# Patient Record
Sex: Male | Born: 1955 | Race: White | Hispanic: No | Marital: Married | Smoking: Former smoker
Health system: Southern US, Community
[De-identification: ages and names within clinical notes are randomized; demographics above are authoritative.]

## PROBLEM LIST (undated history)

## (undated) DIAGNOSIS — Z87442 Personal history of urinary calculi: Secondary | ICD-10-CM

## (undated) DIAGNOSIS — G4730 Sleep apnea, unspecified: Secondary | ICD-10-CM

## (undated) DIAGNOSIS — F329 Major depressive disorder, single episode, unspecified: Secondary | ICD-10-CM

## (undated) DIAGNOSIS — R519 Headache, unspecified: Secondary | ICD-10-CM

## (undated) DIAGNOSIS — E119 Type 2 diabetes mellitus without complications: Secondary | ICD-10-CM

## (undated) DIAGNOSIS — M1990 Unspecified osteoarthritis, unspecified site: Secondary | ICD-10-CM

## (undated) DIAGNOSIS — R51 Headache: Secondary | ICD-10-CM

## (undated) DIAGNOSIS — G20 Parkinson's disease: Secondary | ICD-10-CM

## (undated) DIAGNOSIS — F32A Depression, unspecified: Secondary | ICD-10-CM

## (undated) DIAGNOSIS — F419 Anxiety disorder, unspecified: Secondary | ICD-10-CM

## (undated) DIAGNOSIS — K219 Gastro-esophageal reflux disease without esophagitis: Secondary | ICD-10-CM

## (undated) DIAGNOSIS — E785 Hyperlipidemia, unspecified: Secondary | ICD-10-CM

## (undated) DIAGNOSIS — I10 Essential (primary) hypertension: Secondary | ICD-10-CM

## (undated) DIAGNOSIS — A498 Other bacterial infections of unspecified site: Secondary | ICD-10-CM

## (undated) HISTORY — PX: ROTATOR CUFF REPAIR: SHX139

## (undated) HISTORY — PX: TONSILLECTOMY: SUR1361

## (undated) HISTORY — PX: WISDOM TOOTH EXTRACTION: SHX21

## (undated) HISTORY — PX: HERNIA REPAIR: SHX51

## (undated) HISTORY — PX: OTHER SURGICAL HISTORY: SHX169

---

## 2007-07-12 ENCOUNTER — Ambulatory Visit (HOSPITAL_BASED_OUTPATIENT_CLINIC_OR_DEPARTMENT_OTHER): Admission: RE | Admit: 2007-07-12 | Discharge: 2007-07-12 | Payer: Self-pay | Admitting: Orthopedic Surgery
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
## 2011-05-31 LAB — POCT I-STAT 4, (NA,K, GLUC, HGB,HCT)
Glucose, Bld: 166 — ABNORMAL HIGH
HCT: 45
Hemoglobin: 15.3
Potassium: 4.6
Sodium: 139

## 2016-11-09 ENCOUNTER — Other Ambulatory Visit: Payer: Self-pay | Admitting: Orthopedic Surgery

## 2016-11-23 ENCOUNTER — Encounter (HOSPITAL_COMMUNITY)
Admission: RE | Admit: 2016-11-23 | Discharge: 2016-11-23 | Disposition: A | Discharge status: Home / Self Care | Payer: Medicare FFS | Source: Ambulatory Visit | Attending: Orthopedic Surgery | Admitting: Orthopedic Surgery

## 2016-11-23 ENCOUNTER — Encounter (HOSPITAL_COMMUNITY): Payer: Self-pay

## 2016-11-23 ENCOUNTER — Ambulatory Visit (HOSPITAL_COMMUNITY)
Admission: RE | Admit: 2016-11-23 | Discharge: 2016-11-23 | Disposition: A | Discharge status: Home / Self Care | Payer: Medicare FFS | Source: Ambulatory Visit | Attending: Orthopedic Surgery | Admitting: Orthopedic Surgery

## 2016-11-23 DIAGNOSIS — Z01812 Encounter for preprocedural laboratory examination: Secondary | ICD-10-CM | POA: Insufficient documentation

## 2016-11-23 DIAGNOSIS — E785 Hyperlipidemia, unspecified: Secondary | ICD-10-CM | POA: Insufficient documentation

## 2016-11-23 DIAGNOSIS — G4733 Obstructive sleep apnea (adult) (pediatric): Secondary | ICD-10-CM | POA: Diagnosis not present

## 2016-11-23 DIAGNOSIS — Z01810 Encounter for preprocedural cardiovascular examination: Secondary | ICD-10-CM | POA: Diagnosis not present

## 2016-11-23 DIAGNOSIS — F329 Major depressive disorder, single episode, unspecified: Secondary | ICD-10-CM | POA: Diagnosis not present

## 2016-11-23 DIAGNOSIS — F17200 Nicotine dependence, unspecified, uncomplicated: Secondary | ICD-10-CM | POA: Insufficient documentation

## 2016-11-23 DIAGNOSIS — Z01818 Encounter for other preprocedural examination: Secondary | ICD-10-CM | POA: Diagnosis not present

## 2016-11-23 DIAGNOSIS — K219 Gastro-esophageal reflux disease without esophagitis: Secondary | ICD-10-CM | POA: Diagnosis not present

## 2016-11-23 DIAGNOSIS — E119 Type 2 diabetes mellitus without complications: Secondary | ICD-10-CM | POA: Diagnosis not present

## 2016-11-23 DIAGNOSIS — I10 Essential (primary) hypertension: Secondary | ICD-10-CM | POA: Insufficient documentation

## 2016-11-23 DIAGNOSIS — F419 Anxiety disorder, unspecified: Secondary | ICD-10-CM | POA: Insufficient documentation

## 2016-11-23 HISTORY — DX: Headache, unspecified: R51.9

## 2016-11-23 HISTORY — DX: Anxiety disorder, unspecified: F41.9

## 2016-11-23 HISTORY — DX: Major depressive disorder, single episode, unspecified: F32.9

## 2016-11-23 HISTORY — DX: Gastro-esophageal reflux disease without esophagitis: K21.9

## 2016-11-23 HISTORY — DX: Hyperlipidemia, unspecified: E78.5

## 2016-11-23 HISTORY — DX: Depression, unspecified: F32.A

## 2016-11-23 HISTORY — DX: Unspecified osteoarthritis, unspecified site: M19.90

## 2016-11-23 HISTORY — DX: Parkinson's disease: G20

## 2016-11-23 HISTORY — DX: Personal history of urinary calculi: Z87.442

## 2016-11-23 HISTORY — DX: Type 2 diabetes mellitus without complications: E11.9

## 2016-11-23 HISTORY — DX: Headache: R51

## 2016-11-23 HISTORY — DX: Essential (primary) hypertension: I10

## 2016-11-23 HISTORY — DX: Sleep apnea, unspecified: G47.30

## 2016-11-23 LAB — COMPREHENSIVE METABOLIC PANEL
ALBUMIN: 4.3 g/dL (ref 3.5–5.0)
ALK PHOS: 78 U/L (ref 38–126)
ALT: 21 U/L (ref 17–63)
ANION GAP: 10 (ref 5–15)
AST: 25 U/L (ref 15–41)
BUN: 20 mg/dL (ref 6–20)
CALCIUM: 9.7 mg/dL (ref 8.9–10.3)
CO2: 28 mmol/L (ref 22–32)
Chloride: 100 mmol/L — ABNORMAL LOW (ref 101–111)
Creatinine, Ser: 1 mg/dL (ref 0.61–1.24)
GFR calc Af Amer: >60 mL/min (ref >60)
GFR calc non Af Amer: >60 mL/min (ref >60)
GLUCOSE: 264 mg/dL — AB (ref 65–99)
Potassium: 4.8 mmol/L (ref 3.5–5.1)
SODIUM: 138 mmol/L (ref 135–145)
Total Bilirubin: 0.6 mg/dL (ref 0.3–1.2)
Total Protein: 7.1 g/dL (ref 6.5–8.1)

## 2016-11-23 LAB — URINALYSIS, ROUTINE W REFLEX MICROSCOPIC
Bacteria, UA: NONE SEEN
Bilirubin Urine: NEGATIVE
Hgb urine dipstick: NEGATIVE
Ketones, ur: NEGATIVE mg/dL
Leukocytes, UA: NEGATIVE
Nitrite: NEGATIVE
PH: 5 (ref 5.0–8.0)
Protein, ur: NEGATIVE mg/dL
SPECIFIC GRAVITY, URINE: 1.023 (ref 1.005–1.030)
Squamous Epithelial / LPF: NONE SEEN

## 2016-11-23 LAB — CBC WITH DIFFERENTIAL/PLATELET
BASOS PCT: 0 %
Basophils Absolute: 0 10*3/uL (ref 0.0–0.1)
EOS ABS: 0.3 10*3/uL (ref 0.0–0.7)
Eosinophils Relative: 3 %
HCT: 40.5 % (ref 39.0–52.0)
Hemoglobin: 13.3 g/dL (ref 13.0–17.0)
Lymphocytes Relative: 29 %
Lymphs Abs: 2.3 10*3/uL (ref 0.7–4.0)
MCH: 29.2 pg (ref 26.0–34.0)
MCHC: 32.8 g/dL (ref 30.0–36.0)
MCV: 88.8 fL (ref 78.0–100.0)
MONOS PCT: 8 %
Monocytes Absolute: 0.6 10*3/uL (ref 0.1–1.0)
NEUTROS PCT: 60 %
Neutro Abs: 4.6 10*3/uL (ref 1.7–7.7)
Platelets: 308 10*3/uL (ref 150–400)
RBC: 4.56 MIL/uL (ref 4.22–5.81)
RDW: 11.9 % (ref 11.5–15.5)
WBC: 7.7 10*3/uL (ref 4.0–10.5)

## 2016-11-23 LAB — GLUCOSE, CAPILLARY: GLUCOSE-CAPILLARY: 290 mg/dL — AB (ref 65–99)

## 2016-11-23 LAB — ABO/RH: ABO/RH(D): B NEG

## 2016-11-23 LAB — SURGICAL PCR SCREEN
MRSA, PCR: NEGATIVE
STAPHYLOCOCCUS AUREUS: NEGATIVE

## 2016-11-23 LAB — TYPE AND SCREEN
ABO/RH(D): B NEG
ANTIBODY SCREEN: NEGATIVE

## 2016-11-23 LAB — PROTIME-INR
INR: 0.98
Prothrombin Time: 13 seconds (ref 11.4–15.2)

## 2016-11-23 LAB — APTT: APTT: 28 s (ref 24–36)

## 2016-11-23 NOTE — Progress Notes (Addendum)
Sleep Study requested from [facility]   No cardiologist  Stress done greater than 10 years ago

## 2016-11-23 NOTE — Pre-Procedure Instructions (Signed)
Ryan Le  11/23/2016      [name] [address] [place] [address] Phone: [phone] Fax: [phone]    Your procedure is scheduled on 12-02-2016   Friday .  Report to [facility] Admitting at 5:30 A.M.   Call this number if you have problems the morning of surgery:  [phone]   Remember:  Do not eat food or drink liquids after midnight .  Take these medicines the morning of surgery with A SIP OF WATER Atorvastatin(Lipitor)Carbidopa-Levodopa(Sinemet),cetirizine(Zyrtec) if needed,citalopram(Celexa),Xyzal if needed.omeprazole(Prilosec),rasagiline(Azilect  STOP ASPIRIN,ANTIINFLAMATORIES (IBUPROFEN,ALEVE,MOTRIN,ADVIL,GOODY'S POWDERS),HERBAL SUPPLEMENTS,FISH OIL,AND VITAMINS 5-7 DAYS PRIOR TO SURGERY      How to Manage Your Diabetes Before and After Surgery  Why is it important to control my blood sugar before and after surgery? . Improving blood sugar levels before and after surgery helps healing and can limit problems. . A way of improving blood sugar control is eating a healthy diet by: o  Eating less sugar and carbohydrates o  Increasing activity/exercise o  Talking with your doctor about reaching your blood sugar goals . High blood sugars (greater than 180 mg/dL) can raise your risk of infections and slow your recovery, so you will need to focus on controlling your diabetes during the weeks before surgery. . Make sure that the doctor who takes care of your diabetes knows about your planned surgery including the date and location.  How do I manage my blood sugar before surgery? . Check your blood sugar at least 4 times a day, starting 2 days before surgery, to make sure that the level is not too high or low. o Check your blood sugar the morning of your surgery when you wake up and every 2 hours until you get to the Short Stay unit. . If your blood sugar is less than 70 mg/dL, you will need  to treat for low blood sugar: o Do not take insulin. o Treat a low blood sugar (less than 70 mg/dL) with  cup of clear juice (cranberry or apple), 4 glucose tablets, OR glucose gel. o Recheck blood sugar in 15 minutes after treatment (to make sure it is greater than 70 mg/dL). If your blood sugar is not greater than 70 mg/dL on recheck, call [phone] for further instructions. . Report your blood sugar to the short stay nurse when you get to Short Stay.  . If you are admitted to the hospital after surgery: o Your blood sugar will be checked by the staff and you will probably be given insulin after surgery (instead of oral diabetes medicines) to make sure you have good blood sugar levels. o The goal for blood sugar control after surgery is 80-180 mg/dL.              WHAT DO I DO ABOUT MY DIABETES MEDICATION?   Marland Kitchen Do not take oral diabetes medicines (pills) the morning of surgery.Metoformin(Glucophage),glimepiride(Amaryl)       . The day of surgery, do not take other diabetes injectables, including Byetta (exenatide), Bydureon (exenatide ER), Victoza (liraglutide), or Trulicity (dulaglutide).  Reviewed and Endorsed by [facility] Patient Education Committee, August 2015   Do not wear jewelry, .  Do not wear lotions, powders, or perfumes, or deoderant.  Do not shave 48 hours prior to surgery.  Men may shave face and neck.  Do not bring valuables to the hospital.  [facility] is not responsible for any belongings or  valuables.  Contacts, dentures or bridgework may not be worn into surgery.  Leave your suitcase in the car.  After surgery it may be brought to your room.  For patients admitted to the hospital, discharge time will be determined by your treatment team.  Patients discharged the day of surgery will not be allowed to drive home.   Special Instructions: [facility] - Preparing for Surgery  Before surgery, you can play an important role.  Because skin is not  sterile, your skin needs to be as free of germs as possible.  You can reduce the number of germs on you skin by washing with CHG (chlorahexidine gluconate) soap before surgery.  CHG is an antiseptic cleaner which kills germs and bonds with the skin to continue killing germs even after washing.  Please DO NOT use if you have an allergy to CHG or antibacterial soaps.  If your skin becomes reddened/irritated stop using the CHG and inform your nurse when you arrive at Short Stay.  Do not shave (including legs and underarms) for at least 48 hours prior to the first CHG shower.  You may shave your face.  Please follow these instructions carefully:   1.  Shower with CHG Soap the night before surgery and the   morning of Surgery.  2.  If you choose to wash your hair, wash your hair first as usual with your normal shampoo.  3.  After you shampoo, rinse your hair and body thoroughly to remove the  Shampoo.  4.  Use CHG as you would any other liquid soap.  You can apply chg directly  to the skin and wash gently with scrungie or a clean washcloth.  5.  Apply the CHG Soap to your body ONLY FROM THE NECK DOWN.   Do not use on open wounds or open sores.  Avoid contact with your eyes,  ears, mouth and genitals (private parts).  Wash genitals (private parts) with your normal soap.  6.  Wash thoroughly, paying special attention to the area where your surgery will be performed.  7.  Thoroughly rinse your body with warm water from the neck down.  8.  DO NOT shower/wash with your normal soap after using and rinsing o  the CHG Soap.  9.  Pat yourself dry with a clean towel.            10.  Wear clean pajamas.            11.  Place clean sheets on your bed the night of your first shower and do not sleep with pets.  Day of Surgery  Do not apply any lotions/deodorants the morning of surgery.  Please wear clean clothes to the hospital/surgery center.   Please read over the following fact sheets that you were  given. MRSA Information and Surgical Site Infection Prevention Incentive Spirometry

## 2016-11-24 ENCOUNTER — Other Ambulatory Visit (HOSPITAL_COMMUNITY): Payer: Self-pay

## 2016-11-24 ENCOUNTER — Encounter (HOSPITAL_COMMUNITY): Payer: Self-pay | Admitting: Vascular Surgery

## 2016-11-24 LAB — HEMOGLOBIN A1C
HEMOGLOBIN A1C: 8.9 % — AB (ref 4.8–5.6)
MEAN PLASMA GLUCOSE: 209 mg/dL

## 2016-11-24 NOTE — Progress Notes (Addendum)
Anesthesia Chart Review: Patient is a 61 year old male scheduled for left TKA on 12/02/16 by Dr. Berenice Primas.  History includes former smoker, hypertension, OSA (no CPAP; uses O2 2L), DM2, depression, anxiety, GERD, Parkinson's disease, arthritis, HLD, nephrolithiasis, tonsillectomy, umbilical hernia repair. BMI is consistent with super morbid obesity.  Meds include aspirin 81 mg, Lipitor, Sinemet IR, Zyrtec, Celexa, Klonopin, Vasotec, Amaryl, levocetirizine, metformin, fish oil, Prilosec, Actos, rasagiline (MAO inhibitor type B).  No PCP is listed in Epic.   BP (!) 141/81   Pulse 83   Temp 37.1 C   Resp 20   Ht 5' (1.524 m)   Wt 279 lb 3.2 oz (126.6 kg)   SpO2 97%   BMI 54.53 kg/m   EKG 11/23/16: NSR.  CXR 11/23/16: IMPRESSION: There is no active cardiopulmonary disease.  Sleep study requested from [facility].  Preoperative labs noted. Glucose 264. A1c 8.9 consistent with average glucose of 209. Creatinine 1.0. CBC and PT/PTT within normal limits. UA negative for leukocytes and nitrites, glucose > 500.  I notified anesthesiologist Dr. Deatra Canter that patient is on an MAO inhibitor type B. He will need MAO-safe anesthesia. I left a voice message with Elmyra Ricks at Dr. Berenice Primas' office to 1) make sure Dr. Berenice Primas is aware that patient is on a MAO inhibitor and 2) that patient's diabetes is not optimally controlled with an A1c of 8.9. If Dr. Berenice Primas wants to proceed as planned then patient would get a fasting CBG on arrival with potential for delay or cancellation if glucose is > 200. (Update 11/28/16 1:05 PM: I confirmed with Elmyra Ricks that she received my voice message. She will review with Dr. Berenice Primas.)  Myra Gianotti, PA-C [facility] Short Stay Center/Anesthesiology Phone [phone] 11/24/2016 6:17 PM

## 2016-11-24 NOTE — Anesthesia Preprocedure Evaluation (Deleted)
Anesthesia Evaluation    Airway        Dental   Pulmonary former smoker,           Cardiovascular hypertension,      Neuro/Psych    GI/Hepatic   Endo/Other  diabetes  Renal/GU      Musculoskeletal   Abdominal   Peds  Hematology   Anesthesia Other Findings   Reproductive/Obstetrics                             Anesthesia Physical Anesthesia Plan  ASA:   Anesthesia Plan:    Post-op Pain Management:    Induction:   Airway Management Planned:   Additional Equipment:   Intra-op Plan:   Post-operative Plan:   Informed Consent:   Plan Discussed with:   Anesthesia Plan Comments: (Patient is on rasagiline--a MAO inhibitor type B. Needs MAO safe anesthesia. Myra Gianotti, PA-C)        Anesthesia Quick Evaluation

## 2016-12-02 ENCOUNTER — Encounter (HOSPITAL_COMMUNITY): Admission: RE | Payer: Self-pay | Source: Ambulatory Visit

## 2016-12-02 ENCOUNTER — Inpatient Hospital Stay (HOSPITAL_COMMUNITY): Admission: RE | Admit: 2016-12-02 | Payer: Medicare FFS | Source: Ambulatory Visit | Admitting: Orthopedic Surgery

## 2016-12-02 SURGERY — ARTHROPLASTY, KNEE, TOTAL
Anesthesia: Spinal | Laterality: Left

## 2017-02-06 ENCOUNTER — Other Ambulatory Visit: Payer: Self-pay | Admitting: Orthopedic Surgery

## 2017-03-23 NOTE — Patient Instructions (Addendum)
Ryan Le  03/23/2017   Your procedure is scheduled on: 03-31-17  Report to [facility] Main  Entrance Take [place]  elevators to 3rd floor to  [place] at 5:30 AM.    Call this number if you have problems the morning of surgery [phone]   Remember: ONLY 1 PERSON MAY GO WITH YOU TO SHORT STAY TO GET  READY MORNING OF [place].  Do not eat food or drink liquids :After Midnight.     Take these medicines the morning of surgery with A SIP OF WATER: Atorvastatin (Lipitor), Citalopram (Celexa), and Azilect (Rasgiline), and Carbidopa-Levodopa (Sinimet), and Ropinirole (Requip)  DO NOT TAKE ANY DIABETIC MEDICATIONS DAY OF YOUR SURGERY                               You may not have any metal on your body including hair pins and              piercings  Do not wear jewelry, lotions, powders, deodorant             Men may shave face and neck.   Do not bring valuables to the hospital. [place].  Contacts, dentures or bridgework may not be worn into surgery.  Leave suitcase in the car. After surgery it may be brought to your room.     Please read over the following fact sheets you were given: _____________________________________________________________________            How to Manage Your Diabetes Before and After Surgery  Why is it important to control my blood sugar before and after surgery? . Improving blood sugar levels before and after surgery helps healing and can limit problems. . A way of improving blood sugar control is eating a healthy diet by: o  Eating less sugar and carbohydrates o  Increasing activity/exercise o  Talking with your doctor about reaching your blood sugar goals . High blood sugars (greater than 180 mg/dL) can raise your risk of infections and slow your recovery, so you will need to focus on controlling your diabetes during the weeks before surgery. . Make sure that the  doctor who takes care of your diabetes knows about your planned surgery including the date and location.  How do I manage my blood sugar before surgery? . Check your blood sugar at least 4 times a day, starting 2 days before surgery, to make sure that the level is not too high or low. o Check your blood sugar the morning of your surgery when you wake up and every 2 hours until you get to the Short Stay unit. . If your blood sugar is less than 70 mg/dL, you will need to treat for low blood sugar: o Do not take insulin. o Treat a low blood sugar (less than 70 mg/dL) with  cup of clear juice (cranberry or apple), 4 glucose tablets, OR glucose gel. o Recheck blood sugar in 15 minutes after treatment (to make sure it is greater than 70 mg/dL). If your blood sugar is not greater than 70 mg/dL on recheck, call [phone] for further instructions. . Report your blood sugar to the short stay nurse when you get to Short Stay.  . If  you are admitted to the hospital after surgery: o Your blood sugar will be checked by the staff and you will probably be given insulin after surgery (instead of oral diabetes medicines) to make sure you have good blood sugar levels. o The goal for blood sugar control after surgery is 80-180 mg/dL.   WHAT DO I DO ABOUT MY DIABETES MEDICATION?  Marland Kitchen Do not take oral diabetes medicines (pills) the morning of surgery.  . THE DAY BEFORE SURGERY, take your usual dose of Metformin, your morning and/or lunch dose of Glimepiride (Amaryl), (Jardiance) and  18  units of Lantus      insulin.       . THE MORNING OF SURGERY take 9 Units of Lantus  . The day of surgery, do not take other diabetes injectables, including Byetta (exenatide), Bydureon (exenatide ER), Victoza (liraglutide), or Trulicity (dulaglutide).     Patient Signature:  Date:   Nurse Signature:  Date:   Reviewed and Endorsed by [facility] Patient Education Committee, August 2015  [facility] - Preparing for  Surgery Before surgery, you can play an important role.  Because skin is not sterile, your skin needs to be as free of germs as possible.  You can reduce the number of germs on your skin by washing with CHG (chlorahexidine gluconate) soap before surgery.  CHG is an antiseptic cleaner which kills germs and bonds with the skin to continue killing germs even after washing. Please DO NOT use if you have an allergy to CHG or antibacterial soaps.  If your skin becomes reddened/irritated stop using the CHG and inform your nurse when you arrive at Short Stay. Do not shave (including legs and underarms) for at least 48 hours prior to the first CHG shower.  You may shave your face/neck. Please follow these instructions carefully:  1.  Shower with CHG Soap the night before surgery and the  morning of Surgery.  2.  If you choose to wash your hair, wash your hair first as usual with your  normal  shampoo.  3.  After you shampoo, rinse your hair and body thoroughly to remove the  shampoo.                           4.  Use CHG as you would any other liquid soap.  You can apply chg directly  to the skin and wash                       Gently with a scrungie or clean washcloth.  5.  Apply the CHG Soap to your body ONLY FROM THE NECK DOWN.   Do not use on face/ open                           Wound or open sores. Avoid contact with eyes, ears mouth and genitals (private parts).                       Wash face,  Genitals (private parts) with your normal soap.             6.  Wash thoroughly, paying special attention to the area where your surgery  will be performed.  7.  Thoroughly rinse your body with warm water from the neck down.  8.  DO NOT shower/wash with your normal soap after using and rinsing off  the CHG Soap.                9.  Pat yourself dry with a clean towel.            10.  Wear clean pajamas.            11.  Place clean sheets on your bed the night of your first shower and do not  sleep with pets. Day  of Surgery : Do not apply any lotions/deodorants the morning of surgery.  Please wear clean clothes to the hospital/surgery center.  FAILURE TO FOLLOW THESE INSTRUCTIONS MAY RESULT IN THE CANCELLATION OF YOUR SURGERY PATIENT SIGNATURE_________________________________  NURSE SIGNATURE__________________________________  ________________________________________________________________________   Ryan Le  An incentive spirometer is a tool that can help keep your lungs clear and active. This tool measures how well you are filling your lungs with each breath. Taking long deep breaths may help reverse or decrease the chance of developing breathing (pulmonary) problems (especially infection) following:  A long period of time when you are unable to move or be active. BEFORE THE PROCEDURE   If the spirometer includes an indicator to show your best effort, your nurse or respiratory therapist will set it to a desired goal.  If possible, sit up straight or lean slightly forward. Try not to slouch.  Hold the incentive spirometer in an upright position. INSTRUCTIONS FOR USE  1. Sit on the edge of your bed if possible, or sit up as far as you can in bed or on a chair. 2. Hold the incentive spirometer in an upright position. 3. Breathe out normally. 4. Place the mouthpiece in your mouth and seal your lips tightly around it. 5. Breathe in slowly and as deeply as possible, raising the piston or the ball toward the top of the column. 6. Hold your breath for 3-5 seconds or for as long as possible. Allow the piston or ball to fall to the bottom of the column. 7. Remove the mouthpiece from your mouth and breathe out normally. 8. Rest for a few seconds and repeat Steps 1 through 7 at least 10 times every 1-2 hours when you are awake. Take your time and take a few normal breaths between deep breaths. 9. The spirometer may include an indicator to show your best effort. Use the indicator as a goal  to work toward during each repetition. 10. After each set of 10 deep breaths, practice coughing to be sure your lungs are clear. If you have an incision (the cut made at the time of surgery), support your incision when coughing by placing a pillow or rolled up towels firmly against it. Once you are able to get out of bed, walk around indoors and cough well. You may stop using the incentive spirometer when instructed by your caregiver.  RISKS AND COMPLICATIONS  Take your time so you do not get dizzy or light-headed.  If you are in pain, you may need to take or ask for pain medication before doing incentive spirometry. It is harder to take a deep breath if you are having pain. AFTER USE  Rest and breathe slowly and easily.  It can be helpful to keep track of a log of your progress. Your caregiver can provide you with a simple table to help with this. If you are using the spirometer at home, follow these instructions: [place] IF:   You are having difficultly using the spirometer.  You have trouble using the spirometer as  often as instructed.  Your pain medication is not giving enough relief while using the spirometer.  You develop fever of 100.5 F (38.1 C) or higher. SEEK IMMEDIATE MEDICAL CARE IF:   You cough up bloody sputum that had not been present before.  You develop fever of 102 F (38.9 C) or greater.  You develop worsening pain at or near the incision site. MAKE SURE YOU:   Understand these instructions.  Will watch your condition.  Will get help right away if you are not doing well or get worse. Document Released: 12/19/2006 Document Revised: 10/31/2011 Document Reviewed: 02/19/2007 [facility] Patient Information 2014 [place], [place].   ________________________________________________________________________

## 2017-03-23 NOTE — Progress Notes (Signed)
01-31-17 HGA1C 6.1 on chart 11-23-16 (EPIC) EKG, CXR

## 2017-03-24 ENCOUNTER — Ambulatory Visit (HOSPITAL_COMMUNITY)
Admission: RE | Admit: 2017-03-24 | Discharge: 2017-03-24 | Disposition: A | Discharge status: Home / Self Care | Payer: Medicare FFS | Source: Ambulatory Visit | Attending: Orthopedic Surgery | Admitting: Orthopedic Surgery

## 2017-03-24 ENCOUNTER — Encounter (HOSPITAL_COMMUNITY): Payer: Self-pay

## 2017-03-24 ENCOUNTER — Encounter (HOSPITAL_COMMUNITY)
Admission: RE | Admit: 2017-03-24 | Discharge: 2017-03-24 | Disposition: A | Discharge status: Home / Self Care | Payer: Medicare FFS | Source: Ambulatory Visit | Attending: Orthopedic Surgery | Admitting: Orthopedic Surgery

## 2017-03-24 DIAGNOSIS — Z01812 Encounter for preprocedural laboratory examination: Secondary | ICD-10-CM | POA: Insufficient documentation

## 2017-03-24 DIAGNOSIS — Z01818 Encounter for other preprocedural examination: Secondary | ICD-10-CM | POA: Diagnosis present

## 2017-03-24 DIAGNOSIS — Z794 Long term (current) use of insulin: Secondary | ICD-10-CM | POA: Insufficient documentation

## 2017-03-24 DIAGNOSIS — Z79899 Other long term (current) drug therapy: Secondary | ICD-10-CM | POA: Insufficient documentation

## 2017-03-24 DIAGNOSIS — M1712 Unilateral primary osteoarthritis, left knee: Secondary | ICD-10-CM | POA: Diagnosis not present

## 2017-03-24 LAB — COMPREHENSIVE METABOLIC PANEL
ALBUMIN: 4.3 g/dL (ref 3.5–5.0)
ALT: 13 U/L — AB (ref 17–63)
AST: 29 U/L (ref 15–41)
Alkaline Phosphatase: 62 U/L (ref 38–126)
Anion gap: 10 (ref 5–15)
BUN: 14 mg/dL (ref 6–20)
CO2: 25 mmol/L (ref 22–32)
CREATININE: 0.9 mg/dL (ref 0.61–1.24)
Calcium: 9.4 mg/dL (ref 8.9–10.3)
Chloride: 106 mmol/L (ref 101–111)
GFR calc Af Amer: >60 mL/min (ref >60)
GFR calc non Af Amer: >60 mL/min (ref >60)
GLUCOSE: 82 mg/dL (ref 65–99)
POTASSIUM: 4.1 mmol/L (ref 3.5–5.1)
SODIUM: 141 mmol/L (ref 135–145)
Total Bilirubin: 0.8 mg/dL (ref 0.3–1.2)
Total Protein: 7.5 g/dL (ref 6.5–8.1)

## 2017-03-24 LAB — URINALYSIS, ROUTINE W REFLEX MICROSCOPIC
BACTERIA UA: NONE SEEN
BILIRUBIN URINE: NEGATIVE
Glucose, UA: >=500 mg/dL — AB
Hgb urine dipstick: NEGATIVE
KETONES UR: 5 mg/dL — AB
LEUKOCYTES UA: NEGATIVE
NITRITE: NEGATIVE
PROTEIN: NEGATIVE mg/dL
SQUAMOUS EPITHELIAL / LPF: NONE SEEN
Specific Gravity, Urine: 1.03 (ref 1.005–1.030)
pH: 7 (ref 5.0–8.0)

## 2017-03-24 LAB — APTT: APTT: 32 s (ref 24–36)

## 2017-03-24 LAB — CBC WITH DIFFERENTIAL/PLATELET
BASOS ABS: 0 10*3/uL (ref 0.0–0.1)
BASOS PCT: 0 %
EOS ABS: 0.1 10*3/uL (ref 0.0–0.7)
EOS PCT: 1 %
HCT: 39.3 % (ref 39.0–52.0)
Hemoglobin: 13.4 g/dL (ref 13.0–17.0)
LYMPHS PCT: 39 %
Lymphs Abs: 3.1 10*3/uL (ref 0.7–4.0)
MCH: 28.8 pg (ref 26.0–34.0)
MCHC: 34.1 g/dL (ref 30.0–36.0)
MCV: 84.3 fL (ref 78.0–100.0)
MONO ABS: 0.7 10*3/uL (ref 0.1–1.0)
Monocytes Relative: 9 %
Neutro Abs: 4.1 10*3/uL (ref 1.7–7.7)
Neutrophils Relative %: 51 %
Platelets: 256 10*3/uL (ref 150–400)
RBC: 4.66 MIL/uL (ref 4.22–5.81)
RDW: 12.5 % (ref 11.5–15.5)
WBC: 8 10*3/uL (ref 4.0–10.5)

## 2017-03-24 LAB — PROTIME-INR
INR: 1.01
Prothrombin Time: 13.3 seconds (ref 11.4–15.2)

## 2017-03-24 LAB — GLUCOSE, CAPILLARY: GLUCOSE-CAPILLARY: 107 mg/dL — AB (ref 65–99)

## 2017-03-24 LAB — SURGICAL PCR SCREEN
MRSA, PCR: NEGATIVE
STAPHYLOCOCCUS AUREUS: NEGATIVE

## 2017-03-24 NOTE — Progress Notes (Signed)
03-23-17 UA result, routed to Dr. Berenice Primas for review

## 2017-03-28 NOTE — Progress Notes (Signed)
03-28-17 HGA1C routed to Dr. Wynelle Link for review

## 2017-03-30 NOTE — H&P (Addendum)
TOTAL KNEE ADMISSION H&P  Patient is being admitted for left total knee arthroplasty.  Subjective:  Chief Complaint:left knee pain.  HPI: Ryan Le, 61 y.o. male, has a history of pain and functional disability in the left knee due to arthritis and has failed non-surgical conservative treatments for greater than 12 weeks to includeNSAID's and/or analgesics, corticosteriod injections, viscosupplementation injections and activity modification.  Onset of symptoms was abrupt, starting 1 years ago with rapidlly worsening course since that time. The patient noted prior procedures on the knee to include  arthroscopy and menisectomy on the left knee(s).  Patient currently rates pain in the left knee(s) at 9 out of 10 with activity. Patient has night pain, worsening of pain with activity and weight bearing, pain that interferes with activities of daily living, pain with passive range of motion, crepitus and joint swelling.  Patient has evidence of subchondral cysts, subchondral sclerosis and joint space narrowing by imaging studies. This patient has had Failure of all reasonable conservative care. There is no active infection.Pt has history of Parkinsons disease and will likely need 2 midnights for a safe and reasonable recovery.  There are no active problems to display for this patient.  Past Medical History:  Diagnosis Date  . Anxiety   . Arthritis   . Depression   . Diabetes mellitus without complication ([place])   . GERD (gastroesophageal reflux disease)   . Headache    occasionally  . History of kidney stones   . Hyperlipemia   . Hypertension   . Parkinson disease ([place])   . Sleep apnea    no CPAP  uses O2 @ 2 l @night     Past Surgical History:  Procedure Laterality Date  . arthroscopic knee  Left   . HERNIA REPAIR     umbilical  . kidney stone removal    . ROTATOR CUFF REPAIR Bilateral   . TONSILLECTOMY    . WISDOM TOOTH EXTRACTION      No prescriptions prior to admission.    Allergies  Allergen Reactions  . Latex Rash    Social History  Substance Use Topics  . Smoking status: Former Smoker    Packs/day: 1.00    Years: 15.00    Types: Cigarettes    Quit date: 1993  . Smokeless tobacco: Never Used  . Alcohol use Yes     Comment: occasionally    No family history on file.   ROS ROS: I have reviewed the patient's review of systems thoroughly and there are no positive responses as relates to the HPI. Objective:  Physical Exam  Vital signs in last 24 hours:    Vitals:   03/31/17 0535  BP: (!) 99/54  Pulse: 75  Resp: 18  Temp: 98.1 F (36.7 C)  SpO2: 100%   Well-developed well-nourished patient in no acute distress. Alert and oriented x3 HEENT:within normal limits Cardiac: Regular rate and rhythm Pulmonary: Lungs clear to auscultation Abdomen: Soft and nontender.  Normal active bowel sounds  Musculoskeletal: (Left knee: Limited range of motion. Painful range of motion. Trace effusion. No instability. Neurovascularly intact distally. Labs: Recent Results (from the past 2160 hour(s))  Urinalysis, Routine w reflex microscopic     Status: Abnormal   Collection Time: 03/24/17  1:50 PM  Result Value Ref Range   Color, Urine AMBER (A) YELLOW    Comment: BIOCHEMICALS MAY BE AFFECTED BY COLOR   APPearance HAZY (A) CLEAR   Specific Gravity, Urine 1.030 1.005 - 1.030   pH  7.0 5.0 - 8.0   Glucose, UA >=500 (A) NEGATIVE mg/dL   Hgb urine dipstick NEGATIVE NEGATIVE   Bilirubin Urine NEGATIVE NEGATIVE   Ketones, ur 5 (A) NEGATIVE mg/dL   Protein, ur NEGATIVE NEGATIVE mg/dL   Nitrite NEGATIVE NEGATIVE   Leukocytes, UA NEGATIVE NEGATIVE   RBC / HPF 0-5 0 - 5 RBC/hpf   WBC, UA 0-5 0 - 5 WBC/hpf   Bacteria, UA NONE SEEN NONE SEEN   Squamous Epithelial / LPF NONE SEEN NONE SEEN  Glucose, capillary     Status: Abnormal   Collection Time: 03/24/17  2:11 PM  Result Value Ref Range   Glucose-Capillary 107 (H) 65 - 99 mg/dL  APTT     Status: None    Collection Time: 03/24/17  2:41 PM  Result Value Ref Range   aPTT 32 24 - 36 seconds  CBC WITH DIFFERENTIAL     Status: None   Collection Time: 03/24/17  2:41 PM  Result Value Ref Range   WBC 8.0 4.0 - 10.5 K/uL   RBC 4.66 4.22 - 5.81 MIL/uL   Hemoglobin 13.4 13.0 - 17.0 g/dL   HCT 39.3 39.0 - 52.0 %   MCV 84.3 78.0 - 100.0 fL   MCH 28.8 26.0 - 34.0 pg   MCHC 34.1 30.0 - 36.0 g/dL   RDW 12.5 11.5 - 15.5 %   Platelets 256 150 - 400 K/uL   Neutrophils Relative % 51 %   Neutro Abs 4.1 1.7 - 7.7 K/uL   Lymphocytes Relative 39 %   Lymphs Abs 3.1 0.7 - 4.0 K/uL   Monocytes Relative 9 %   Monocytes Absolute 0.7 0.1 - 1.0 K/uL   Eosinophils Relative 1 %   Eosinophils Absolute 0.1 0.0 - 0.7 K/uL   Basophils Relative 0 %   Basophils Absolute 0.0 0.0 - 0.1 K/uL  Comprehensive metabolic panel     Status: Abnormal   Collection Time: 03/24/17  2:41 PM  Result Value Ref Range   Sodium 141 135 - 145 mmol/L   Potassium 4.1 3.5 - 5.1 mmol/L   Chloride 106 101 - 111 mmol/L   CO2 25 22 - 32 mmol/L   Glucose, Bld 82 65 - 99 mg/dL   BUN 14 6 - 20 mg/dL   Creatinine, Ser 0.90 0.61 - 1.24 mg/dL   Calcium 9.4 8.9 - 10.3 mg/dL   Total Protein 7.5 6.5 - 8.1 g/dL   Albumin 4.3 3.5 - 5.0 g/dL   AST 29 15 - 41 U/L   ALT 13 (L) 17 - 63 U/L   Alkaline Phosphatase 62 38 - 126 U/L   Total Bilirubin 0.8 0.3 - 1.2 mg/dL   GFR calc non Af Amer >60 >60 mL/min   GFR calc Af Amer >60 >60 mL/min    Comment: (NOTE) The eGFR has been calculated using the CKD EPI equation. This calculation has not been validated in all clinical situations. eGFR's persistently <60 mL/min signify possible Chronic Kidney Disease.    Anion gap 10 5 - 15  Protime-INR     Status: None   Collection Time: 03/24/17  2:41 PM  Result Value Ref Range   Prothrombin Time 13.3 11.4 - 15.2 seconds   INR 1.01   Surgical pcr screen     Status: None   Collection Time: 03/24/17  2:41 PM  Result Value Ref Range   MRSA, PCR NEGATIVE  NEGATIVE   Staphylococcus aureus NEGATIVE NEGATIVE    Comment:  The Xpert SA Assay (FDA approved for NASAL specimens in patients over 72 years of age), is one component of a comprehensive surveillance program.  Test performance has been validated by [facility] for patients greater than or equal to 37 year old. It is not intended to diagnose infection nor to guide or monitor treatment.    .Labs Estimated body mass index is 33.63 kg/m as calculated from the following:   Height as of 03/24/17: 6' (1.829 m).   Weight as of 03/24/17: 112.5 kg (248 lb).   Imaging Review Plain radiographs demonstrate severe degenerative joint disease of the left knee(s). The overall alignment ismild varus. The bone quality appears to be good for age and reported activity level.  Assessment/Plan:  End stage arthritis, left knee   The patient history, physical examination, clinical judgment of the provider and imaging studies are consistent with end stage degenerative joint disease of the left knee(s) and total knee arthroplasty is deemed medically necessary. The treatment options including medical management, injection therapy arthroscopy and arthroplasty were discussed at length. The risks and benefits of total knee arthroplasty were presented and reviewed. The risks due to aseptic loosening, infection, stiffness, patella tracking problems, thromboembolic complications and other imponderables were discussed. The patient acknowledged the explanation, agreed to proceed with the plan and consent was signed. Patient is being admitted for inpatient treatment for surgery, pain control, PT, OT, prophylactic antibiotics, VTE prophylaxis, progressive ambulation and ADL's and discharge planning. The patient is planning to be discharged home with home health services

## 2017-03-30 NOTE — Anesthesia Preprocedure Evaluation (Addendum)
Anesthesia Evaluation  Patient identified by MRN, date of birth, ID band Patient awake    Reviewed: Allergy & Precautions, NPO status , Patient's Chart, lab work & pertinent test results  Airway Mallampati: II  TM Distance: >3 FB Neck ROM: Full    Dental no notable dental hx. (+) Dental Advisory Given   Pulmonary sleep apnea and Oxygen sleep apnea , former smoker,    Pulmonary exam normal breath sounds clear to auscultation       Cardiovascular hypertension, Normal cardiovascular exam Rhythm:Regular Rate:Normal     Neuro/Psych  Headaches, PSYCHIATRIC DISORDERS Anxiety Depression Parkinson's Disease    GI/Hepatic Neg liver ROS, GERD  ,  Endo/Other  diabetesObesity  Renal/GU negative Renal ROS  negative genitourinary   Musculoskeletal  (+) Arthritis ,   Abdominal   Peds  Hematology negative hematology ROS (+)   Anesthesia Other Findings   Reproductive/Obstetrics                            Anesthesia Physical Anesthesia Plan  ASA: II  Anesthesia Plan: Spinal and MAC   Post-op Pain Management:  Regional for Post-op pain   Induction: Intravenous  PONV Risk Score and Plan: 1 and Ondansetron, Treatment may vary due to age or medical condition and Dexamethasone  Airway Management Planned: Nasal Cannula  Additional Equipment:   Intra-op Plan:   Post-operative Plan: Extubation in OR  Informed Consent: I have reviewed the patients History and Physical, chart, labs and discussed the procedure including the risks, benefits and alternatives for the proposed anesthesia with the patient or authorized representative who has indicated his/her understanding and acceptance.   Dental advisory given  Plan Discussed with: CRNA  Anesthesia Plan Comments:         Anesthesia Quick Evaluation

## 2017-03-31 ENCOUNTER — Encounter (HOSPITAL_COMMUNITY)
Admission: RE | Disposition: A | Discharge status: Home Health | Payer: Self-pay | Source: Ambulatory Visit | Attending: Orthopedic Surgery

## 2017-03-31 ENCOUNTER — Inpatient Hospital Stay (HOSPITAL_COMMUNITY): Payer: Medicare FFS | Admitting: Anesthesiology

## 2017-03-31 ENCOUNTER — Encounter (HOSPITAL_COMMUNITY): Payer: Self-pay

## 2017-03-31 ENCOUNTER — Inpatient Hospital Stay (HOSPITAL_COMMUNITY)
Admission: RE | Admit: 2017-03-31 | Discharge: 2017-04-03 | DRG: 470 | Disposition: A | Discharge status: Home Health | Payer: Medicare FFS | Source: Ambulatory Visit | Attending: Orthopedic Surgery | Admitting: Orthopedic Surgery

## 2017-03-31 DIAGNOSIS — I10 Essential (primary) hypertension: Secondary | ICD-10-CM | POA: Diagnosis present

## 2017-03-31 DIAGNOSIS — Z87891 Personal history of nicotine dependence: Secondary | ICD-10-CM

## 2017-03-31 DIAGNOSIS — Z7984 Long term (current) use of oral hypoglycemic drugs: Secondary | ICD-10-CM

## 2017-03-31 DIAGNOSIS — E785 Hyperlipidemia, unspecified: Secondary | ICD-10-CM | POA: Diagnosis present

## 2017-03-31 DIAGNOSIS — Z87442 Personal history of urinary calculi: Secondary | ICD-10-CM | POA: Diagnosis not present

## 2017-03-31 DIAGNOSIS — Z91040 Latex allergy status: Secondary | ICD-10-CM | POA: Diagnosis not present

## 2017-03-31 DIAGNOSIS — Z7982 Long term (current) use of aspirin: Secondary | ICD-10-CM

## 2017-03-31 DIAGNOSIS — G4730 Sleep apnea, unspecified: Secondary | ICD-10-CM | POA: Diagnosis present

## 2017-03-31 DIAGNOSIS — M1712 Unilateral primary osteoarthritis, left knee: Secondary | ICD-10-CM | POA: Diagnosis present

## 2017-03-31 DIAGNOSIS — G20 Parkinson's disease: Secondary | ICD-10-CM | POA: Diagnosis present

## 2017-03-31 DIAGNOSIS — F419 Anxiety disorder, unspecified: Secondary | ICD-10-CM | POA: Diagnosis present

## 2017-03-31 DIAGNOSIS — K219 Gastro-esophageal reflux disease without esophagitis: Secondary | ICD-10-CM | POA: Diagnosis present

## 2017-03-31 DIAGNOSIS — E119 Type 2 diabetes mellitus without complications: Secondary | ICD-10-CM | POA: Diagnosis present

## 2017-03-31 DIAGNOSIS — F329 Major depressive disorder, single episode, unspecified: Secondary | ICD-10-CM | POA: Diagnosis present

## 2017-03-31 DIAGNOSIS — M25562 Pain in left knee: Secondary | ICD-10-CM | POA: Diagnosis present

## 2017-03-31 HISTORY — PX: TOTAL KNEE ARTHROPLASTY: SHX125

## 2017-03-31 LAB — GLUCOSE, CAPILLARY
GLUCOSE-CAPILLARY: 117 mg/dL — AB (ref 65–99)
GLUCOSE-CAPILLARY: 178 mg/dL — AB (ref 65–99)
Glucose-Capillary: 109 mg/dL — ABNORMAL HIGH (ref 65–99)

## 2017-03-31 SURGERY — ARTHROPLASTY, KNEE, TOTAL
Anesthesia: Monitor Anesthesia Care | Site: Knee | Laterality: Left

## 2017-03-31 MED ORDER — INSULIN GLARGINE 100 UNIT/ML ~~LOC~~ SOLN
18.0000 [IU] | Freq: Every day | SUBCUTANEOUS | Status: DC
  Administered 2017-04-01 – 2017-04-03 (×3): 18 [IU] via SUBCUTANEOUS
  Filled 2017-03-31 (×3): qty 0.18

## 2017-03-31 MED ORDER — MIDAZOLAM HCL 5 MG/5ML IJ SOLN
INTRAMUSCULAR | Status: DC | PRN
  Administered 2017-03-31: 2 mg via INTRAVENOUS

## 2017-03-31 MED ORDER — CITALOPRAM HYDROBROMIDE 20 MG PO TABS
20.0000 mg | ORAL_TABLET | Freq: Two times a day (BID) | ORAL | Status: DC
  Administered 2017-04-01 – 2017-04-02 (×4): 20 mg via ORAL
  Filled 2017-03-31 (×5): qty 1

## 2017-03-31 MED ORDER — BUPIVACAINE-EPINEPHRINE 0.5% -1:200000 IJ SOLN
INTRAMUSCULAR | Status: AC
  Filled 2017-03-31: qty 1

## 2017-03-31 MED ORDER — CARBIDOPA-LEVODOPA 25-100 MG PO TABS
2.0000 | ORAL_TABLET | Freq: Three times a day (TID) | ORAL | Status: DC
  Administered 2017-03-31 – 2017-04-03 (×8): 2 via ORAL
  Filled 2017-03-31 (×9): qty 2

## 2017-03-31 MED ORDER — ACETAMINOPHEN 500 MG PO TABS: 1000.0000 mg | ORAL_TABLET | Freq: Two times a day (BID) | ORAL | Status: DC | PRN

## 2017-03-31 MED ORDER — ACETAMINOPHEN 325 MG PO TABS
650.0000 mg | ORAL_TABLET | Freq: Four times a day (QID) | ORAL | Status: DC | PRN
  Administered 2017-04-01: 16:00:00 650 mg via ORAL
  Filled 2017-03-31: qty 2

## 2017-03-31 MED ORDER — DEXAMETHASONE SODIUM PHOSPHATE 10 MG/ML IJ SOLN
INTRAMUSCULAR | Status: AC
  Filled 2017-03-31: qty 1

## 2017-03-31 MED ORDER — SODIUM CHLORIDE 0.9 % IJ SOLN
INTRAMUSCULAR | Status: DC | PRN
  Administered 2017-03-31: 30 mL

## 2017-03-31 MED ORDER — LACTATED RINGERS IV SOLN: INTRAVENOUS | Status: DC

## 2017-03-31 MED ORDER — HYDROMORPHONE HCL-NACL 0.5-0.9 MG/ML-% IV SOSY
0.5000 mg | PREFILLED_SYRINGE | INTRAVENOUS | Status: DC | PRN
  Administered 2017-03-31 – 2017-04-01 (×3): 1 mg via INTRAVENOUS
  Administered 2017-04-01: 0.5 mg via INTRAVENOUS
  Administered 2017-04-01 – 2017-04-02 (×2): 1 mg via INTRAVENOUS
  Filled 2017-03-31 (×10): qty 2

## 2017-03-31 MED ORDER — MIDAZOLAM HCL 2 MG/2ML IJ SOLN
INTRAMUSCULAR | Status: AC
  Filled 2017-03-31: qty 2

## 2017-03-31 MED ORDER — ROPINIROLE HCL 0.5 MG PO TABS
0.5000 mg | ORAL_TABLET | Freq: Three times a day (TID) | ORAL | Status: DC
  Administered 2017-03-31 – 2017-04-02 (×7): 0.5 mg via ORAL
  Filled 2017-03-31 (×8): qty 1

## 2017-03-31 MED ORDER — METFORMIN HCL 500 MG PO TABS: 1000.0000 mg | ORAL_TABLET | Freq: Two times a day (BID) | ORAL | Status: DC

## 2017-03-31 MED ORDER — POLYETHYLENE GLYCOL 3350 17 G PO PACK: 17.0000 g | PACK | Freq: Every day | ORAL | Status: DC | PRN

## 2017-03-31 MED ORDER — HYDROMORPHONE HCL-NACL 0.5-0.9 MG/ML-% IV SOSY
PREFILLED_SYRINGE | INTRAVENOUS | Status: AC
  Administered 2017-04-01: 1 mg via INTRAVENOUS
  Filled 2017-03-31: qty 1

## 2017-03-31 MED ORDER — DIPHENHYDRAMINE HCL 12.5 MG/5ML PO ELIX: 12.5000 mg | ORAL_SOLUTION | ORAL | Status: DC | PRN

## 2017-03-31 MED ORDER — PROPOFOL 10 MG/ML IV BOLUS
INTRAVENOUS | Status: AC
  Filled 2017-03-31: qty 20

## 2017-03-31 MED ORDER — LIDOCAINE 2% (20 MG/ML) 5 ML SYRINGE
INTRAMUSCULAR | Status: DC | PRN
  Administered 2017-03-31: 100 mg via INTRAVENOUS

## 2017-03-31 MED ORDER — HYDROMORPHONE HCL-NACL 0.5-0.9 MG/ML-% IV SOSY
PREFILLED_SYRINGE | INTRAVENOUS | Status: AC
  Administered 2017-03-31: 21:00:00 1 mg via INTRAVENOUS
  Filled 2017-03-31: qty 1

## 2017-03-31 MED ORDER — MAGNESIUM CITRATE PO SOLN
1.0000 | Freq: Once | ORAL | Status: DC | PRN
Start: 2017-03-31 — End: 2017-04-03

## 2017-03-31 MED ORDER — METHOCARBAMOL 500 MG PO TABS
500.0000 mg | ORAL_TABLET | Freq: Four times a day (QID) | ORAL | Status: DC | PRN
  Administered 2017-04-01 – 2017-04-03 (×6): 500 mg via ORAL
  Filled 2017-03-31 (×6): qty 1

## 2017-03-31 MED ORDER — ASPIRIN EC 325 MG PO TBEC
325.0000 mg | DELAYED_RELEASE_TABLET | Freq: Two times a day (BID) | ORAL | Status: DC
  Administered 2017-04-01 – 2017-04-03 (×5): 325 mg via ORAL
  Filled 2017-03-31 (×5): qty 1

## 2017-03-31 MED ORDER — RASAGILINE MESYLATE 1 MG PO TABS
1.0000 mg | ORAL_TABLET | Freq: Every day | ORAL | Status: DC
  Administered 2017-04-01 – 2017-04-03 (×3): 1 mg via ORAL
  Filled 2017-03-31 (×3): qty 1

## 2017-03-31 MED ORDER — ACETAMINOPHEN 650 MG RE SUPP
650.0000 mg | Freq: Four times a day (QID) | RECTAL | Status: DC | PRN
Start: 2017-03-31 — End: 2017-04-03

## 2017-03-31 MED ORDER — LORATADINE 10 MG PO TABS: 10.0000 mg | ORAL_TABLET | Freq: Every day | ORAL | Status: DC | PRN

## 2017-03-31 MED ORDER — LIDOCAINE 2% (20 MG/ML) 5 ML SYRINGE
INTRAMUSCULAR | Status: AC
  Filled 2017-03-31: qty 5

## 2017-03-31 MED ORDER — CEFAZOLIN SODIUM-DEXTROSE 2-4 GM/100ML-% IV SOLN
2.0000 g | Freq: Four times a day (QID) | INTRAVENOUS | Status: AC
  Administered 2017-03-31 (×2): 2 g via INTRAVENOUS
  Filled 2017-03-31 (×2): qty 100

## 2017-03-31 MED ORDER — LACTATED RINGERS IV SOLN
INTRAVENOUS | Status: DC | PRN
  Administered 2017-03-31: 07:00:00 via INTRAVENOUS

## 2017-03-31 MED ORDER — CLONAZEPAM 0.5 MG PO TABS
0.5000 mg | ORAL_TABLET | Freq: Every day | ORAL | Status: DC
  Administered 2017-03-31 – 2017-04-02 (×3): 0.5 mg via ORAL
  Filled 2017-03-31 (×3): qty 1

## 2017-03-31 MED ORDER — B COMPLEX PO TABS: 1.0000 | ORAL_TABLET | Freq: Every day | ORAL | Status: DC

## 2017-03-31 MED ORDER — BUPIVACAINE LIPOSOME 1.3 % IJ SUSP
20.0000 mL | Freq: Once | INTRAMUSCULAR | Status: AC
  Administered 2017-03-31: 20 mL
  Filled 2017-03-31: qty 20

## 2017-03-31 MED ORDER — ASPIRIN EC 325 MG PO TBEC: 325.0000 mg | DELAYED_RELEASE_TABLET | Freq: Two times a day (BID) | ORAL | Status: DC

## 2017-03-31 MED ORDER — FENTANYL CITRATE (PF) 100 MCG/2ML IJ SOLN
25.0000 ug | INTRAMUSCULAR | Status: DC | PRN
Start: 2017-03-31 — End: 2017-03-31

## 2017-03-31 MED ORDER — SODIUM CHLORIDE 0.9 % IV SOLN
INTRAVENOUS | Status: DC
  Administered 2017-03-31: 20:00:00 via INTRAVENOUS

## 2017-03-31 MED ORDER — DOCUSATE SODIUM 100 MG PO CAPS
100.0000 mg | ORAL_CAPSULE | Freq: Two times a day (BID) | ORAL | Status: DC
  Administered 2017-03-31 – 2017-04-03 (×6): 100 mg via ORAL
  Filled 2017-03-31 (×6): qty 1

## 2017-03-31 MED ORDER — GABAPENTIN 300 MG PO CAPS
300.0000 mg | ORAL_CAPSULE | Freq: Two times a day (BID) | ORAL | Status: DC
  Administered 2017-03-31 – 2017-04-02 (×6): 300 mg via ORAL
  Filled 2017-03-31 (×7): qty 1

## 2017-03-31 MED ORDER — ONDANSETRON HCL 4 MG/2ML IJ SOLN: 4.0000 mg | Freq: Four times a day (QID) | INTRAMUSCULAR | Status: DC | PRN

## 2017-03-31 MED ORDER — FENTANYL CITRATE (PF) 100 MCG/2ML IJ SOLN
INTRAMUSCULAR | Status: DC | PRN
  Administered 2017-03-31: 50 ug via INTRAVENOUS

## 2017-03-31 MED ORDER — ATORVASTATIN CALCIUM 10 MG PO TABS
10.0000 mg | ORAL_TABLET | Freq: Every day | ORAL | Status: DC
  Administered 2017-04-01 – 2017-04-03 (×3): 10 mg via ORAL
  Filled 2017-03-31 (×3): qty 1

## 2017-03-31 MED ORDER — BISACODYL 5 MG PO TBEC: 5.0000 mg | DELAYED_RELEASE_TABLET | Freq: Every day | ORAL | Status: DC | PRN

## 2017-03-31 MED ORDER — CEFAZOLIN SODIUM-DEXTROSE 2-4 GM/100ML-% IV SOLN
2.0000 g | INTRAVENOUS | Status: AC
  Administered 2017-03-31: 2 g via INTRAVENOUS

## 2017-03-31 MED ORDER — BUPIVACAINE HCL (PF) 0.75 % IJ SOLN
INTRAMUSCULAR | Status: DC | PRN
  Administered 2017-03-31: 12 mg via INTRATHECAL

## 2017-03-31 MED ORDER — CEFAZOLIN SODIUM-DEXTROSE 2-4 GM/100ML-% IV SOLN
INTRAVENOUS | Status: AC
  Filled 2017-03-31: qty 100

## 2017-03-31 MED ORDER — ALUM & MAG HYDROXIDE-SIMETH 200-200-20 MG/5ML PO SUSP: 30.0000 mL | ORAL | Status: DC | PRN

## 2017-03-31 MED ORDER — PROPOFOL 500 MG/50ML IV EMUL
INTRAVENOUS | Status: DC | PRN
  Administered 2017-03-31: 40 ug/kg/min via INTRAVENOUS

## 2017-03-31 MED ORDER — METHOCARBAMOL 1000 MG/10ML IJ SOLN
500.0000 mg | Freq: Four times a day (QID) | INTRAVENOUS | Status: DC | PRN
  Administered 2017-03-31 – 2017-04-01 (×2): 500 mg via INTRAVENOUS
  Filled 2017-03-31: qty 550
  Filled 2017-03-31: qty 5

## 2017-03-31 MED ORDER — FENTANYL CITRATE (PF) 100 MCG/2ML IJ SOLN
INTRAMUSCULAR | Status: AC
  Filled 2017-03-31: qty 2

## 2017-03-31 MED ORDER — TRANEXAMIC ACID 1000 MG/10ML IV SOLN
1000.0000 mg | Freq: Once | INTRAVENOUS | Status: AC
  Administered 2017-03-31: 12:00:00 1000 mg via INTRAVENOUS
  Filled 2017-03-31: qty 1100

## 2017-03-31 MED ORDER — LEVOCETIRIZINE DIHYDROCHLORIDE 5 MG PO TABS: 5.0000 mg | ORAL_TABLET | Freq: Every day | ORAL | Status: DC | PRN

## 2017-03-31 MED ORDER — ACETAMINOPHEN ER 650 MG PO TBCR: 1300.0000 mg | EXTENDED_RELEASE_TABLET | Freq: Two times a day (BID) | ORAL | Status: DC | PRN

## 2017-03-31 MED ORDER — INSULIN GLARGINE 100 UNIT/ML SOLOSTAR PEN: 18.0000 [IU] | PEN_INJECTOR | Freq: Every day | SUBCUTANEOUS | Status: DC

## 2017-03-31 MED ORDER — OXYCODONE HCL 5 MG PO TABS
5.0000 mg | ORAL_TABLET | ORAL | Status: DC | PRN
  Administered 2017-03-31 – 2017-04-03 (×12): 10 mg via ORAL
  Filled 2017-03-31 (×14): qty 2

## 2017-03-31 MED ORDER — BUPIVACAINE-EPINEPHRINE (PF) 0.5% -1:200000 IJ SOLN
INTRAMUSCULAR | Status: DC | PRN
  Administered 2017-03-31: 18 mL

## 2017-03-31 MED ORDER — ENALAPRIL MALEATE 10 MG PO TABS
20.0000 mg | ORAL_TABLET | Freq: Every day | ORAL | Status: DC
Start: 2017-03-31 — End: 2017-04-03
  Administered 2017-03-31 – 2017-04-03 (×4): 20 mg via ORAL
  Filled 2017-03-31 (×4): qty 2

## 2017-03-31 MED ORDER — CHLORHEXIDINE GLUCONATE 4 % EX LIQD: 60.0000 mL | Freq: Once | CUTANEOUS | Status: DC

## 2017-03-31 MED ORDER — ADULT MULTIVITAMIN W/MINERALS CH
1.0000 | ORAL_TABLET | Freq: Every day | ORAL | Status: DC
  Administered 2017-03-31 – 2017-04-03 (×4): 1 via ORAL
  Filled 2017-03-31 (×4): qty 1

## 2017-03-31 MED ORDER — HYDROMORPHONE HCL-NACL 0.5-0.9 MG/ML-% IV SOSY
0.2500 mg | PREFILLED_SYRINGE | INTRAVENOUS | Status: DC | PRN
  Administered 2017-03-31 (×4): 0.5 mg via INTRAVENOUS

## 2017-03-31 MED ORDER — METFORMIN HCL 500 MG PO TABS
1000.0000 mg | ORAL_TABLET | Freq: Two times a day (BID) | ORAL | Status: DC
  Administered 2017-03-31 – 2017-04-03 (×6): 1000 mg via ORAL
  Filled 2017-03-31 (×6): qty 2

## 2017-03-31 MED ORDER — TRANEXAMIC ACID 1000 MG/10ML IV SOLN
1000.0000 mg | INTRAVENOUS | Status: AC
  Administered 2017-03-31: 1000 mg via INTRAVENOUS
  Filled 2017-03-31: qty 1100

## 2017-03-31 MED ORDER — LORATADINE 10 MG PO TABS
10.0000 mg | ORAL_TABLET | Freq: Every day | ORAL | Status: DC
  Administered 2017-04-03: 10 mg via ORAL
  Filled 2017-03-31 (×3): qty 1

## 2017-03-31 MED ORDER — PANTOPRAZOLE SODIUM 40 MG PO TBEC
40.0000 mg | DELAYED_RELEASE_TABLET | Freq: Every day | ORAL | Status: DC
  Administered 2017-03-31 – 2017-04-02 (×3): 40 mg via ORAL
  Filled 2017-03-31 (×3): qty 1

## 2017-03-31 MED ORDER — ONDANSETRON HCL 4 MG/2ML IJ SOLN
INTRAMUSCULAR | Status: DC | PRN
  Administered 2017-03-31: 4 mg via INTRAVENOUS

## 2017-03-31 MED ORDER — SODIUM CHLORIDE 0.9 % IR SOLN
Status: DC | PRN
  Administered 2017-03-31: 1000 mL

## 2017-03-31 MED ORDER — HYDROMORPHONE HCL-NACL 0.5-0.9 MG/ML-% IV SOSY
PREFILLED_SYRINGE | INTRAVENOUS | Status: AC
  Administered 2017-04-01: 13:00:00 1 mg via INTRAVENOUS
  Filled 2017-03-31: qty 1

## 2017-03-31 MED ORDER — ONDANSETRON HCL 4 MG/2ML IJ SOLN
INTRAMUSCULAR | Status: AC
  Filled 2017-03-31: qty 2

## 2017-03-31 MED ORDER — HYDROMORPHONE HCL-NACL 0.5-0.9 MG/ML-% IV SOSY
PREFILLED_SYRINGE | INTRAVENOUS | Status: AC
  Administered 2017-04-01: 0.5 mg via INTRAVENOUS
  Filled 2017-03-31: qty 1

## 2017-03-31 MED ORDER — PROPOFOL 10 MG/ML IV BOLUS
INTRAVENOUS | Status: AC
  Filled 2017-03-31: qty 40

## 2017-03-31 MED ORDER — SODIUM CHLORIDE 0.9 % IJ SOLN
INTRAMUSCULAR | Status: AC
  Filled 2017-03-31: qty 50

## 2017-03-31 MED ORDER — CELECOXIB 200 MG PO CAPS
200.0000 mg | ORAL_CAPSULE | Freq: Two times a day (BID) | ORAL | Status: DC
  Administered 2017-03-31 – 2017-04-03 (×7): 200 mg via ORAL
  Filled 2017-03-31 (×7): qty 1

## 2017-03-31 MED ORDER — DEXAMETHASONE SODIUM PHOSPHATE 10 MG/ML IJ SOLN
10.0000 mg | Freq: Two times a day (BID) | INTRAMUSCULAR | Status: AC
  Administered 2017-03-31 – 2017-04-01 (×2): 10 mg via INTRAVENOUS
  Filled 2017-03-31 (×2): qty 1

## 2017-03-31 MED ORDER — PROMETHAZINE HCL 25 MG/ML IJ SOLN: 6.2500 mg | INTRAMUSCULAR | Status: DC | PRN

## 2017-03-31 MED ORDER — ONDANSETRON HCL 4 MG PO TABS: 4.0000 mg | ORAL_TABLET | Freq: Four times a day (QID) | ORAL | Status: DC | PRN

## 2017-03-31 MED ORDER — DEXAMETHASONE SODIUM PHOSPHATE 10 MG/ML IJ SOLN
INTRAMUSCULAR | Status: DC | PRN
  Administered 2017-03-31: 4 mg via INTRAVENOUS

## 2017-03-31 MED ORDER — CANAGLIFLOZIN 100 MG PO TABS
100.0000 mg | ORAL_TABLET | Freq: Every day | ORAL | Status: DC
  Administered 2017-04-01 – 2017-04-03 (×3): 100 mg via ORAL
  Filled 2017-03-31 (×3): qty 1

## 2017-03-31 MED ORDER — FENTANYL CITRATE (PF) 100 MCG/2ML IJ SOLN: 25.0000 ug | INTRAMUSCULAR | Status: DC | PRN

## 2017-03-31 MED ORDER — B COMPLEX-C PO TABS
1.0000 | ORAL_TABLET | Freq: Every day | ORAL | Status: DC
  Administered 2017-03-31 – 2017-04-03 (×4): 1 via ORAL
  Filled 2017-03-31 (×4): qty 1

## 2017-03-31 MED ORDER — GLIMEPIRIDE 4 MG PO TABS
4.0000 mg | ORAL_TABLET | Freq: Every day | ORAL | Status: DC
  Administered 2017-04-01 – 2017-04-03 (×3): 4 mg via ORAL
  Filled 2017-03-31 (×3): qty 1

## 2017-03-31 SURGICAL SUPPLY — 51 items
BAG ZIPLOCK 12X15 (MISCELLANEOUS) ×3 IMPLANT
BANDAGE ACE 4X5 VEL STRL LF (GAUZE/BANDAGES/DRESSINGS) ×3 IMPLANT
BANDAGE ACE 6X5 VEL STRL LF (GAUZE/BANDAGES/DRESSINGS) ×3 IMPLANT
BENZOIN TINCTURE PRP APPL 2/3 (GAUZE/BANDAGES/DRESSINGS) ×3 IMPLANT
BLADE SAG 18X100X1.27 (BLADE) ×3 IMPLANT
BLADE SAW SGTL 13.0X1.19X90.0M (BLADE) ×3 IMPLANT
BOOTIES KNEE HIGH SLOAN (MISCELLANEOUS) IMPLANT
BOWL SMART MIX CTS (DISPOSABLE) ×3 IMPLANT
CAPT KNEE TOTAL 3 ATTUNE ×3 IMPLANT
CEMENT HV SMART SET (Cement) ×6 IMPLANT
CLOSURE WOUND 1/2 X4 (GAUZE/BANDAGES/DRESSINGS) ×1
CUFF TOURN SGL QUICK 34 (TOURNIQUET CUFF) ×2
CUFF TRNQT CYL 34X4X40X1 (TOURNIQUET CUFF) ×1 IMPLANT
DRAPE U-SHAPE 47X51 STRL (DRAPES) ×3 IMPLANT
DRSG ADAPTIC 3X8 NADH LF (GAUZE/BANDAGES/DRESSINGS) ×3 IMPLANT
DRSG AQUACEL AG ADV 3.5X10 (GAUZE/BANDAGES/DRESSINGS) ×3 IMPLANT
DRSG MEPILEX BORDER 4X8 (GAUZE/BANDAGES/DRESSINGS) ×3 IMPLANT
DRSG PAD ABDOMINAL 8X10 ST (GAUZE/BANDAGES/DRESSINGS) ×3 IMPLANT
DURAPREP 26ML APPLICATOR (WOUND CARE) ×3 IMPLANT
ELECT REM PT RETURN 15FT ADLT (MISCELLANEOUS) ×3 IMPLANT
EVACUATOR 1/8 PVC DRAIN (DRAIN) ×3 IMPLANT
GAUZE SPONGE 4X4 12PLY STRL (GAUZE/BANDAGES/DRESSINGS) ×3 IMPLANT
GLOVE BIO SURGEON STRL SZ8 (GLOVE) ×3 IMPLANT
GLOVE BIOGEL PI IND STRL 8 (GLOVE) ×2 IMPLANT
GLOVE BIOGEL PI INDICATOR 8 (GLOVE) ×4
GLOVE ECLIPSE 7.5 STRL STRAW (GLOVE) IMPLANT
GLOVE ORTHO TXT STRL SZ7.5 (GLOVE) IMPLANT
GOWN SPEC L4 XLG W/TWL (GOWN DISPOSABLE) ×6 IMPLANT
HANDPIECE INTERPULSE COAX TIP (DISPOSABLE) ×2
HOOD PEEL AWAY FLYTE STAYCOOL (MISCELLANEOUS) ×6 IMPLANT
IMMOBILIZER KNEE 20 (SOFTGOODS)
IMMOBILIZER KNEE 20 THIGH 36 (SOFTGOODS) IMPLANT
MANIFOLD NEPTUNE II (INSTRUMENTS) ×3 IMPLANT
NS IRRIG 1000ML POUR BTL (IV SOLUTION) ×3 IMPLANT
PACK TOTAL KNEE CUSTOM (KITS) ×3 IMPLANT
PAD ABD 8X10 STRL (GAUZE/BANDAGES/DRESSINGS) ×3 IMPLANT
PADDING CAST COTTON 6X4 STRL (CAST SUPPLIES) ×3 IMPLANT
POSITIONER SURGICAL ARM (MISCELLANEOUS) ×3 IMPLANT
SET HNDPC FAN SPRY TIP SCT (DISPOSABLE) ×1 IMPLANT
STAPLER VISISTAT 35W (STAPLE) IMPLANT
STRIP CLOSURE SKIN 1/2X4 (GAUZE/BANDAGES/DRESSINGS) ×2 IMPLANT
SUT MNCRL AB 3-0 PS2 18 (SUTURE) ×3 IMPLANT
SUT VIC AB 0 CT1 36 (SUTURE) ×6 IMPLANT
SUT VIC AB 1 CT1 36 (SUTURE) ×6 IMPLANT
SUT VIC AB 2-0 CT1 27 (SUTURE) ×2
SUT VIC AB 2-0 CT1 TAPERPNT 27 (SUTURE) ×1 IMPLANT
SWABSTK COMLB BENZOIN TINCTURE (MISCELLANEOUS) ×3 IMPLANT
SYR 20CC LL (SYRINGE) ×3 IMPLANT
SYR 50ML LL SCALE MARK (SYRINGE) ×3 IMPLANT
TOWEL OR NON WOVEN STRL DISP B (DISPOSABLE) ×3 IMPLANT
WATER STERILE IRR 1000ML POUR (IV SOLUTION) ×3 IMPLANT

## 2017-03-31 NOTE — Transfer of Care (Signed)
Immediate Anesthesia Transfer of Care Note  Patient: DINNIS ROG  Procedure(s) Performed: Procedure(s): LEFT TOTAL KNEE ARTHROPLASTY (Left)  Patient Location: PACU  Anesthesia Type:MAC and Spinal  Level of Consciousness: awake, alert , oriented and patient cooperative  Airway & Oxygen Therapy: Patient Spontanous Breathing and Patient connected to face mask oxygen  Post-op Assessment: Report given to RN, Post -op Vital signs reviewed and stable and Patient moving all extremities  Post vital signs: Reviewed and stable  Last Vitals:  Vitals:   03/31/17 0535  BP: (!) 99/54  Pulse: 75  Resp: 18  Temp: 36.7 C  SpO2: 100%    Last Pain:  Vitals:   03/31/17 0550  TempSrc:   PainSc: 2       Patients Stated Pain Goal: 3 (10/03/15 3567)  Complications: No apparent anesthesia complications

## 2017-03-31 NOTE — Op Note (Signed)
NAME:  Ryan Le, Ryan Le                    ACCOUNT NO.:  MEDICAL RECORD NO.:  [id number]  LOCATION:                                 FACILITY:  PHYSICIAN:  Alta Corning, M.D.        DATE OF BIRTH:  DATE OF PROCEDURE:  03/31/2017 DATE OF DISCHARGE:                              OPERATIVE REPORT   PREOPERATIVE DIAGNOSIS:  End-stage degenerative joint disease, left knee with bone-on-bone change.  POSTOPERATIVE DIAGNOSIS:  End-stage degenerative joint disease, left knee with bone-on-bone change.  PROCEDURE:  Left total knee replacement with an Attune system size 7 femur, size 8 tibia, 8 mm bridging bearing, and a 41 mm all polyethylene patella.  SURGEON:  Alta Corning, M.D.  ASSISTANT:  None.  ANESTHESIA:  Spinal.  BRIEF HISTORY:  Ryan Le is a 61 year old male with a long history with significant complaints of left knee pain.  He had a previous scope, which had not given him much relief at an outside facility.  He presented to [place], where x-ray showed severe bone-on-bone change.  Because of failure of all conservative care and x-ray showing bone-on-bone change, the patient was having night pain, light activity pain, and failed conservative care, he was taken to the operating room for left total knee replacement.  The patient does have a history of Parkinson's disease and felt that he would need 2 midnights in the hospital to recover from his procedures.  He was brought to the operating room with this in mind.  DESCRIPTION OF PROCEDURE:  The patient was taken to the operating room. After adequate anesthesia was obtained with spinal anesthetic, the patient was placed supine on the operating table.  Left leg was prepped and draped in usual sterile fashion.  Following this, the leg was exsanguinated and blood pressure tourniquet was inflated to 300 mmHg. Following this, a midline incision was made.  The subcutaneous tissue was dissected down the level of the extensor mechanism,  and a medial parapatellar arthrotomy was undertaken.  Following this, medial and lateral meniscus were removed, retropatellar fat pad, synovium in the anterior aspect of the femur, and anterior and posterior cruciates, as well as the medial and lateral meniscus.  Following this, the intramedullary pilot hole was drilled, and a 3-degree valgus inclination cut was made with 9 degrees of distal bone resected and following this, attention was turned towards sizing the femur, it sizes to a 7 anterior and posterior cuts were made, chamfers and box.  Attention was then turned to the tibia, where it was cut perpendicular to its long axis and a spacer block was put in place.  Exparel was put in the back of the knee.  The remaining meniscus were removed.  Following this, attention was turned towards sizing the tibia, it sizes to an 8, it is drilled and keeled and then following this, the trial components were put in place. Patella was cut down to a level of 13 mm.  A 41 all poly patella was chosen and placed with pegs were placed.  Following this, attention was turned towards the removal of the trial components.  The knee was then copiously and  thoroughly lavaged, pulsatile lavage, irrigation and suctioned dry.  The final components were cemented in the place size 8 tibia, size 7 femur, 8 mm bridging bearing trial was placed and held and attention was turned towards the patella, where the patella was placed and held with a clamp.  All excess bone and cement were removed.  The cement was allowed to completely harden and when done, the tourniquet was let down.  All bleeding was controlled with electrocautery.  The knee was copiously and thoroughly irrigated one final time and then the medial parapatellar arthrotomy was closed with 1 Vicryl running, the skin with 0 and 2-0 Vicryl and 3-0 Monocryl subcuticular.  Benzoin and Steri-Strips were applied.  Sterile compressive dressing was applied and the  patient was taken to the recovery room and was noted to be in satisfactory condition with estimated blood loss for procedure being minimal.     Alta Corning, M.D.     Corliss Skains  D:  03/31/2017  T:  03/31/2017  Job:  [id number]

## 2017-03-31 NOTE — Anesthesia Procedure Notes (Signed)
Anesthesia Regional Block: Adductor canal block   Pre-Anesthetic Checklist: ,, timeout performed, Correct Patient, Correct Site, Correct Laterality, Correct Procedure, Correct Position, site marked, Risks and benefits discussed,  Surgical consent,  Pre-op evaluation,  At surgeon's request and post-op pain management  Laterality: Left  Prep: chloraprep       Needles:  Injection technique: Single-shot  Needle Type: Echogenic Needle     Needle Length: 13cm  Needle Gauge: 21     Additional Needles:   Procedures: ultrasound guided,,,,,,,,  Narrative:  Start time: 03/31/2017 7:05 AM End time: 03/31/2017 7:10 AM Injection made incrementally with aspirations every 5 mL.  Performed by: Personally  Anesthesiologist: Renold Don E  Additional Notes: No pain on injection. No increased resistance to injection. Injection made in 5cc increments. Good needle visualization. Patient tolerated the procedure well.

## 2017-03-31 NOTE — Anesthesia Postprocedure Evaluation (Signed)
Anesthesia Post Note  Patient: Ryan Le  Procedure(s) Performed: Procedure(s) (LRB): LEFT TOTAL KNEE ARTHROPLASTY (Left)     Patient location during evaluation: PACU Anesthesia Type: MAC Level of consciousness: oriented and awake and alert Pain management: pain level controlled Vital Signs Assessment: post-procedure vital signs reviewed and stable Respiratory status: spontaneous breathing, respiratory function stable and patient connected to nasal cannula oxygen Cardiovascular status: blood pressure returned to baseline and stable Postop Assessment: no headache and no backache Anesthetic complications: no    Last Vitals:  Vitals:   03/31/17 1045 03/31/17 1100  BP: (!) 155/87 (!) 157/88  Pulse: 82 83  Resp: 15 15  Temp:    SpO2: 97% 95%    Last Pain:  Vitals:   03/31/17 1100  TempSrc:   PainSc: 5     LLE Motor Response: Purposeful movement (03/31/17 1100) LLE Sensation: Tingling (03/31/17 1100) RLE Motor Response: Purposeful movement (03/31/17 1100) RLE Sensation: Tingling (03/31/17 1100) L Sensory Level: S1-Sole of foot, small toes (03/31/17 1100) R Sensory Level: S1-Sole of foot, small toes (03/31/17 1100)  Audry Pili

## 2017-03-31 NOTE — Anesthesia Procedure Notes (Signed)
Spinal  Patient location during procedure: OR Start time: 03/31/2017 7:17 AM End time: 03/31/2017 7:23 AM Staffing Anesthesiologist: Renold Don E Performed: anesthesiologist  Preanesthetic Checklist Completed: patient identified, surgical consent, pre-op evaluation, timeout performed, IV checked, risks and benefits discussed and monitors and equipment checked Spinal Block Patient position: sitting Prep: DuraPrep Patient monitoring: heart rate, cardiac monitor, continuous pulse ox and blood pressure Approach: midline Location: L3-4 Injection technique: single-shot Needle Needle type: Pencan  Needle gauge: 24 G Additional Notes Functioning IV was confirmed and monitors were applied. Sterile prep and drape, including hand hygiene, mask, and sterile gloves were used. The patient was positioned and the spine was prepped. The skin was anesthetized with lidocaine. Free flow of clear CSF was obtained prior to injecting local anesthetic into the CSF. The spinal needle aspirated freely following injection. The needle was carefully withdrawn. The patient tolerated the procedure well. Consent was obtained prior to the procedure with all questions answered and concerns addressed. Risks including, but not limited to, bleeding, infection, nerve damage, paralysis, failed block, inadequate analgesia, allergic reaction, high spinal, itching, and headache were discussed and the patient wished to proceed. First two attempts by CRNA unsuccessful. Third attempt performed by Dr. Fransisco Beau, successful.  Renold Don, MD

## 2017-03-31 NOTE — Brief Op Note (Signed)
03/31/2017  9:10 AM  PATIENT:  Ryan Le  61 y.o. male  PRE-OPERATIVE DIAGNOSIS:  OSTEOARTHRITIS LEFT KNEE  POST-OPERATIVE DIAGNOSIS:  OSTEOARTHRITIS LEFT KNEE  PROCEDURE:  Procedure(s): LEFT TOTAL KNEE ARTHROPLASTY (Left)  SURGEON:  Surgeon(s) and Role:    Dorna Leitz, MD - Primary  PHYSICIAN ASSISTANT:   ASSISTANTS: none   ANESTHESIA:   spinal  EBL:  Total I/O In: 1000 [I.V.:1000] Out: 235 [Urine:200; Blood:35]  BLOOD ADMINISTERED:none  DRAINS: none   LOCAL MEDICATIONS USED:  MARCAINE    and OTHER experel  SPECIMEN:  No Specimen  DISPOSITION OF SPECIMEN:  N/A  COUNTS:  YES  TOURNIQUET:   Total Tourniquet Time Documented: Thigh (Left) - 67 minutes Total: Thigh (Left) - 67 minutes   DICTATION: .Other Dictation: Dictation Number [id number]  PLAN OF CARE: Admit to inpatient   PATIENT DISPOSITION:  PACU - hemodynamically stable.   Delay start of Pharmacological VTE agent (>24hrs) due to surgical blood loss or risk of bleeding: no

## 2017-04-01 LAB — BASIC METABOLIC PANEL WITH GFR
Anion gap: 8 (ref 5–15)
BUN: 16 mg/dL (ref 6–20)
CO2: 28 mmol/L (ref 22–32)
Calcium: 8.7 mg/dL — ABNORMAL LOW (ref 8.9–10.3)
Chloride: 101 mmol/L (ref 101–111)
Creatinine, Ser: 0.88 mg/dL (ref 0.61–1.24)
GFR calc Af Amer: >60 mL/min
GFR calc non Af Amer: >60 mL/min
Glucose, Bld: 196 mg/dL — ABNORMAL HIGH (ref 65–99)
Potassium: 4.4 mmol/L (ref 3.5–5.1)
Sodium: 137 mmol/L (ref 135–145)

## 2017-04-01 LAB — CBC
HCT: 32.8 % — ABNORMAL LOW (ref 39.0–52.0)
Hemoglobin: 10.8 g/dL — ABNORMAL LOW (ref 13.0–17.0)
MCH: 27.6 pg (ref 26.0–34.0)
MCHC: 32.9 g/dL (ref 30.0–36.0)
MCV: 83.7 fL (ref 78.0–100.0)
Platelets: 273 10*3/uL (ref 150–400)
RBC: 3.92 MIL/uL — ABNORMAL LOW (ref 4.22–5.81)
RDW: 12.5 % (ref 11.5–15.5)
WBC: 10 10*3/uL (ref 4.0–10.5)

## 2017-04-01 LAB — GLUCOSE, CAPILLARY
GLUCOSE-CAPILLARY: 186 mg/dL — AB (ref 65–99)
Glucose-Capillary: 214 mg/dL — ABNORMAL HIGH (ref 65–99)
Glucose-Capillary: 237 mg/dL — ABNORMAL HIGH (ref 65–99)
Glucose-Capillary: 206 mg/dL — ABNORMAL HIGH (ref 65–99)

## 2017-04-01 NOTE — Evaluation (Signed)
Physical Therapy Evaluation Patient Details Name: Ryan Le MRN: [id number] DOB: 10/04/1955 Today's Date: 04/01/2017   History of Present Illness  s/p L TKA; PMHx: Parkinson's  Clinical Impression  Pt is s/p TKA resulting in the deficits listed below (see PT Problem List).  Pt will benefit from skilled PT to increase their independence and safety with mobility to allow discharge to the venue listed below.      Follow Up Recommendations Home health PT    Equipment Recommendations  None recommended by PT    Recommendations for Other Services       Precautions / Restrictions Precautions Precautions: Fall Restrictions Weight Bearing Restrictions: No Other Position/Activity Restrictions: WBAT      Mobility  Bed Mobility Overal bed mobility: Needs Assistance Bed Mobility: Supine to Sit     Supine to sit: Min assist     General bed mobility comments: assist with LLE  Transfers Overall transfer level: Needs assistance Equipment used: Rolling walker (2 wheeled) Transfers: Sit to/from Stand Sit to Stand: Min assist         General transfer comment: cues for hand placement and LLE position  Ambulation/Gait Ambulation/Gait assistance: Min assist Ambulation Distance (Feet): 45 Feet Assistive device: Rolling walker (2 wheeled) Gait Pattern/deviations: Step-to pattern;Antalgic     General Gait Details: cues for sequence, RW position  Stairs            Wheelchair Mobility    Modified Rankin (Stroke Patients Only)       Balance                                             Pertinent Vitals/Pain Pain Assessment: 0-10 Pain Score: 5  Pain Location: L knee Pain Descriptors / Indicators: Aching;Sore Pain Intervention(s): Limited activity within patient's tolerance;Monitored during session;Premedicated before session;Repositioned;Ice applied    Home Living Family/patient expects to be discharged to:: Private residence Living  Arrangements: Spouse/significant other Available Help at Discharge: Family;Available 24 hours/day Type of Home: House       Home Layout: One level Home Equipment: [place] - 2 wheels;Cane - single point;Wheelchair - manual      Prior Function Level of Independence: Independent               Hand Dominance        Extremity/Trunk Assessment   Upper Extremity Assessment Upper Extremity Assessment: Defer to OT evaluation    Lower Extremity Assessment Lower Extremity Assessment: LLE deficits/detail LLE Deficits / Details: knee and hip 2+/5, knee AROM 10* to 45*,limited by post op pain        Communication   Communication: No difficulties  Cognition Arousal/Alertness: Awake/alert Behavior During Therapy: WFL for tasks assessed/performed;Flat affect Overall Cognitive Status: Within Functional Limits for tasks assessed                                        General Comments      Exercises Total Joint Exercises Ankle Circles/Pumps: AROM;Both;10 reps Quad Sets: 10 reps;AROM;Both   Assessment/Plan    PT Assessment Patient needs continued PT services  PT Problem List Decreased strength;Decreased range of motion;Decreased mobility;Pain;Decreased knowledge of use of DME       PT Treatment Interventions DME instruction;Gait training;Functional mobility training;Therapeutic activities;Therapeutic exercise;Stair training;Patient/family education  PT Goals (Current goals can be found in the Care Plan section)  Acute Rehab PT Goals Patient Stated Goal: home, get better and have less pain PT Goal Formulation: With patient Time For Goal Achievement: 04/08/17 Potential to Achieve Goals: Good    Frequency 7X/week   Barriers to discharge        Co-evaluation               AM-PAC PT "6 Clicks" Daily Activity  Outcome Measure Difficulty turning over in bed (including adjusting bedclothes, sheets and blankets)?: Total Difficulty moving from  lying on back to sitting on the side of the bed? : Total Difficulty sitting down on and standing up from a chair with arms (e.g., wheelchair, bedside commode, etc,.)?: Total Help needed moving to and from a bed to chair (including a wheelchair)?: A Little Help needed walking in hospital room?: A Little Help needed climbing 3-5 steps with a railing? : A Lot 6 Click Score: 11    End of Session Equipment Utilized During Treatment: Gait belt Activity Tolerance: Patient tolerated treatment well Patient left: with call bell/phone within reach;in chair;with family/visitor present   PT Visit Diagnosis: Pain;Difficulty in walking, not elsewhere classified (R26.2) Pain - Right/Left: Left Pain - part of body: Knee    Time: 1224-4975 PT Time Calculation (min) (ACUTE ONLY): 26 min   Charges:   PT Evaluation $PT Eval Low Complexity: 1 Low PT Treatments $Gait Training: 8-22 mins   PT G CodesKenyon Ana, PT Pager: [phone] 04/01/2017   Kenyon Ana 04/01/2017, 11:08 AM

## 2017-04-01 NOTE — Progress Notes (Signed)
Subjective: 1 Day Post-Op Procedure(s) (LRB): LEFT TOTAL KNEE ARTHROPLASTY (Left)  Activity level:  wbat Diet tolerance:  ok Voiding:  ok Patient reports pain as mild and moderate.    Objective: Vital signs in last 24 hours: Temp:  [98.2 F (36.8 C)-99.1 F (37.3 C)] 98.3 F (36.8 C) (08/11 0534) Pulse Rate:  [69-94] 77 (08/11 0534) Resp:  [11-20] 16 (08/11 0534) BP: (111-170)/(55-92) 111/55 (08/11 0534) SpO2:  [95 %-100 %] 96 % (08/11 0534)  Labs:  Recent Labs  04/01/17 0541  HGB 10.8*    Recent Labs  04/01/17 0541  WBC 10.0  RBC 3.92*  HCT 32.8*  PLT 273    Recent Labs  04/01/17 0541  NA 137  K 4.4  CL 101  CO2 28  BUN 16  CREATININE 0.88  GLUCOSE 196*  CALCIUM 8.7*   No results for input(s): LABPT, INR in the last 72 hours.  Physical Exam:  Neurologically intact ABD soft Neurovascular intact Sensation intact distally Intact pulses distally Dorsiflexion/Plantar flexion intact Incision: dressing C/D/I and no drainage No cellulitis present Compartment soft  Assessment/Plan:  1 Day Post-Op Procedure(s) (LRB): LEFT TOTAL KNEE ARTHROPLASTY (Left) Advance diet Up with therapy Plan for discharge tomorrow Discharge home with home health if doing well and cleared by PT. Continue on 325mg  ASA BID x 4 weeks for DVT prevention. Follow up with Dr. Berenice Primas in office 2 weeks post op.  Ryan Le, Larwance Sachs 04/01/2017, 8:24 AM

## 2017-04-01 NOTE — Progress Notes (Signed)
04/01/17 1500  PT Visit Information  Last PT Received On 04/01/17  Assistance Needed +1  History of Present Illness s/p L TKA; PMHx: Parkinson's  Subjective Data  Patient Stated Goal home, get better and have less pain  Precautions  Precautions Fall  Precaution Comments KI not utilized--pt able to do IND SLRs  Required Braces or Orthoses Knee Immobilizer - Left  Restrictions  Weight Bearing Restrictions No  Other Position/Activity Restrictions WBAT  Pain Assessment  Pain Assessment 0-10  Pain Score 4  Pain Location L knee  Pain Descriptors / Indicators Aching;Sore  Pain Intervention(s) Limited activity within patient's tolerance;Monitored during session;Premedicated before session;Repositioned  Cognition  Arousal/Alertness Awake/alert  Behavior During Therapy WFL for tasks assessed/performed;Flat affect  Overall Cognitive Status Within Functional Limits for tasks assessed  Bed Mobility  Bed Mobility Sit to Supine;Supine to Sit  Supine to sit Min guard  Sit to supine Min assist  General bed mobility comments assist with LLE  Transfers  Overall transfer level Needs assistance  Equipment used Rolling walker (2 wheeled)  Transfers Sit to/from Stand  Sit to Stand Min assist;Min guard  General transfer comment cues for hand placement and LLE position  Ambulation/Gait  Ambulation/Gait assistance Min assist;Min guard  Ambulation Distance (Feet) 60 Feet  Assistive device Rolling walker (2 wheeled)  Gait Pattern/deviations Step-to pattern;Antalgic  General Gait Details cues for sequence, RW position; min to min/guard for safety  Total Joint Exercises  Ankle Circles/Pumps AROM;Both;10 reps  Quad Sets 10 reps;AROM;Both  Heel Slides AAROM;Left;10 reps  Short Arc Quad AROM;Strengthening;Left;10 reps  Hip ABduction/ADduction AAROM;Left;10 reps  Straight Leg Raises Strengthening;AROM;Left;10 reps  Goniometric ROM grossly 5*--60* AAROM  PT - End of Session  Equipment Utilized During  Treatment Gait belt  Activity Tolerance Patient tolerated treatment well  Patient left in bed;with call bell/phone within reach;with bed alarm set;with family/visitor present  Nurse Communication Mobility status  PT - Assessment/Plan  PT Plan Current plan remains appropriate  PT Visit Diagnosis Pain;Difficulty in walking, not elsewhere classified (R26.2)  Pain - Right/Left Left  Pain - part of body Knee  PT Frequency (ACUTE ONLY) 7X/week  Follow Up Recommendations DC plan and follow up therapy as arranged by surgeon  PT equipment 3in1 (PT)  AM-PAC PT "6 Clicks" Daily Activity Outcome Measure  Difficulty turning over in bed (including adjusting bedclothes, sheets and blankets)? 1  Difficulty moving from lying on back to sitting on the side of the bed?  1  Difficulty sitting down on and standing up from a chair with arms (e.g., wheelchair, bedside commode, etc,.)? 1  Help needed moving to and from a bed to chair (including a wheelchair)? 3  Help needed walking in hospital room? 3  Help needed climbing 3-5 steps with a railing?  3  6 Click Score 12  Mobility G Code  CL  Acute Rehab PT Goals  PT Goal Formulation With patient  Time For Goal Achievement 04/08/17  Potential to Achieve Goals Good  PT Time Calculation  PT Start Time (ACUTE ONLY) 1433  PT Stop Time (ACUTE ONLY) 1455  PT Time Calculation (min) (ACUTE ONLY) 22 min  PT General Charges  $$ ACUTE PT VISIT 1 Visit  PT Treatments  $Therapeutic Exercise 8-22 mins

## 2017-04-02 LAB — CBC
HCT: 30.8 % — ABNORMAL LOW (ref 39.0–52.0)
Hemoglobin: 10.4 g/dL — ABNORMAL LOW (ref 13.0–17.0)
MCH: 28.8 pg (ref 26.0–34.0)
MCHC: 33.8 g/dL (ref 30.0–36.0)
MCV: 85.3 fL (ref 78.0–100.0)
Platelets: 276 10*3/uL (ref 150–400)
RBC: 3.61 MIL/uL — ABNORMAL LOW (ref 4.22–5.81)
RDW: 12.7 % (ref 11.5–15.5)
WBC: 10.2 10*3/uL (ref 4.0–10.5)

## 2017-04-02 LAB — GLUCOSE, CAPILLARY
GLUCOSE-CAPILLARY: 156 mg/dL — AB (ref 65–99)
GLUCOSE-CAPILLARY: 136 mg/dL — AB (ref 65–99)
Glucose-Capillary: 188 mg/dL — ABNORMAL HIGH (ref 65–99)

## 2017-04-02 NOTE — Progress Notes (Signed)
Subjective: 2 Days Post-Op Procedure(s) (LRB): LEFT TOTAL KNEE ARTHROPLASTY (Left)   Patient is doing well this morning. He still feels unsteady standing. His wife states that his parkinson's tremor is a little more prominent this morning and is slowing patients progress a little. They would like to stay one more night to rest up and get stronger.  Activity level:  wbat Diet tolerance:  ok Voiding:  ok Patient reports pain as mild.    Objective: Vital signs in last 24 hours: Temp:  [97.2 F (36.2 C)-98.4 F (36.9 C)] 97.8 F (36.6 C) (08/12 0450) Pulse Rate:  [80-104] 84 (08/12 0450) Resp:  [16-17] 16 (08/12 0450) BP: (149-166)/(71-84) 152/79 (08/12 0450) SpO2:  [94 %-96 %] 95 % (08/12 0450)  Labs:  Recent Labs  04/01/17 0541 04/02/17 0553  HGB 10.8* 10.4*    Recent Labs  04/01/17 0541 04/02/17 0553  WBC 10.0 10.2  RBC 3.92* 3.61*  HCT 32.8* 30.8*  PLT 273 276    Recent Labs  04/01/17 0541  NA 137  K 4.4  CL 101  CO2 28  BUN 16  CREATININE 0.88  GLUCOSE 196*  CALCIUM 8.7*   No results for input(s): LABPT, INR in the last 72 hours.  Physical Exam:  Neurologically intact ABD soft Neurovascular intact Sensation intact distally Intact pulses distally Dorsiflexion/Plantar flexion intact Incision: dressing C/D/I and no drainage No cellulitis present Compartment soft  Assessment/Plan:  2 Days Post-Op Procedure(s) (LRB): LEFT TOTAL KNEE ARTHROPLASTY (Left) Advance diet Up with therapy Plan for discharge tomorrow Discharge home with home health if doing well and cleared by PT. Continue on ASA 325mg  BID for DVT prevention. Follow up with Dr. Berenice Primas in office 2 weeks post op.  Eddi Hymes, Larwance Sachs 04/02/2017, 10:14 AM

## 2017-04-02 NOTE — Progress Notes (Signed)
04/02/17 1500  PT Visit Information  Last PT Received On 04/02/17  Assistance Needed +1  History of Present Illness s/p L TKA; PMHx: Parkinson's  Subjective Data  Patient Stated Goal home, get better and have less pain  Precautions  Precautions Fall;Knee  Precaution Comments KI not utilized--pt able to do IND SLRs  Restrictions  Other Position/Activity Restrictions WBAT  Pain Assessment  Pain Assessment 0-10  Pain Score 4  Pain Location L knee  Pain Descriptors / Indicators Grimacing;Guarding;Sore  Pain Intervention(s) Limited activity within patient's tolerance;Monitored during session;Premedicated before session;Ice applied  Cognition  Arousal/Alertness Awake/alert  Behavior During Therapy WFL for tasks assessed/performed;Flat affect  Overall Cognitive Status Within Functional Limits for tasks assessed  Bed Mobility  Overal bed mobility Needs Assistance  Bed Mobility Sit to Supine;Supine to Sit  Supine to sit Supervision  Sit to supine Supervision  General bed mobility comments for safety, incr time  Transfers  Overall transfer level Needs assistance  Equipment used Rolling walker (2 wheeled)  Transfers Sit to/from Stand  Sit to Stand Supervision;Min guard  General transfer comment cues for hand placement,  LLE position and to control descent  Ambulation/Gait  Ambulation/Gait assistance Supervision  Ambulation Distance (Feet) 170 Feet  Assistive device Rolling walker (2 wheeled)  Gait Pattern/deviations Antalgic;Step-through pattern  General Gait Details cues for step/stride length, trunk extension, heel strike on L  Balance  Overall balance assessment Needs assistance  Standing balance support No upper extremity supported;Single extremity supported;During functional activity  Standing balance-Leahy Scale Fair  Standing balance comment pt more steady today, able to stand with unilateral UE support, reach out of BOS to grasp papertowels after washing hands with  supervision for safety  Total Joint Exercises  Quad Sets 10 reps;AROM;Both  Heel Slides AAROM;Left;10 reps (using sheet loop)  PT - End of Session  Activity Tolerance Patient tolerated treatment well  Patient left in bed;with call bell/phone within reach;with family/visitor present  Nurse Communication Mobility status  PT - Assessment/Plan  PT Plan Current plan remains appropriate  PT Visit Diagnosis Pain;Difficulty in walking, not elsewhere classified (R26.2)  Pain - Right/Left Left  Pain - part of body Knee  PT Frequency (ACUTE ONLY) 7X/week  Follow Up Recommendations DC plan and follow up therapy as arranged by surgeon;Home health PT  PT equipment 3in1 (PT)  AM-PAC PT "6 Clicks" Daily Activity Outcome Measure  Difficulty turning over in bed (including adjusting bedclothes, sheets and blankets)? 3  Difficulty moving from lying on back to sitting on the side of the bed?  3  Difficulty sitting down on and standing up from a chair with arms (e.g., wheelchair, bedside commode, etc,.)? 3  Help needed moving to and from a bed to chair (including a wheelchair)? 3  Help needed walking in hospital room? 3  Help needed climbing 3-5 steps with a railing?  3  6 Click Score 18  Mobility G Code  CK  PT Goal Progression  Progress towards PT goals Progressing toward goals  Acute Rehab PT Goals  PT Goal Formulation With patient  Time For Goal Achievement 04/08/17  Potential to Achieve Goals Good  PT Time Calculation  PT Start Time (ACUTE ONLY) 1455  PT Stop Time (ACUTE ONLY) 1515  PT Time Calculation (min) (ACUTE ONLY) 20 min  PT General Charges  $$ ACUTE PT VISIT 1 Visit  PT Treatments  $Gait Training 8-22 mins

## 2017-04-02 NOTE — Care Management Note (Addendum)
Case Management Note  Patient Details  Name: Ryan Le MRN: [id number] Date of Birth: Nov 28, 1955  Subjective/Objective:   Left TKA                 Action/Plan: Discharge Planning:  NCM spoke to pt and wife at bedside. Pt states he has RW and bedside commode at home. [facility] will deliver CPM on Monday. States his HH is arranged with [facility] in [place]. Will need HHPT orders with F2F. Will fax orders and dc summary to [facility] # [phone] fax # [phone] when available. Spoke to Amy, Insurance risk surveyor for [employer] and verified [employer] is there area.    Expected Discharge Date:                 Expected Discharge Plan:  [place]  In-House Referral:  NA  Discharge planning Services  CM Consult  Post Acute Care Choice:  Home Health Choice offered to:  Patient  DME Arranged:  3-N-1, CPM, Walker rolling DME Agency:  [facility]  HH Arranged:  PT [place]:  [facility]  Status of Service:  Completed, signed off  If discussed at [place] of Stay Meetings, dates discussed:    Additional Comments:  Erenest Rasher, RN 04/02/2017, 9:14 AM

## 2017-04-02 NOTE — Progress Notes (Signed)
Physical Therapy Treatment Patient Details Name: Ryan Le MRN: [id number] DOB: 1955/10/06 Today's Date: 04/02/2017    History of Present Illness s/p L TKA; PMHx: Parkinson's    PT Comments    Pt  Progressing, incr gait distance, more steady today with gait; will continue current plan of care  Follow Up Recommendations  DC plan and follow up therapy as arranged by surgeon     Equipment Recommendations  3in1 (PT)    Recommendations for Other Services       Precautions / Restrictions Precautions Precautions: Fall;Knee Precaution Comments: KI not utilized--pt able to do IND SLRs Restrictions Weight Bearing Restrictions: No Other Position/Activity Restrictions: WBAT    Mobility  Bed Mobility               General bed mobility comments: OOB with wife upon arrival  Transfers Overall transfer level: Needs assistance Equipment used: Rolling walker (2 wheeled) Transfers: Sit to/from Stand Sit to Stand: Supervision;Min guard         General transfer comment: cues for hand placement,  LLE position and to control descent  Ambulation/Gait Ambulation/Gait assistance: Min guard;Supervision Ambulation Distance (Feet): 140 Feet Assistive device: Rolling walker (2 wheeled) Gait Pattern/deviations: Antalgic;Step-through pattern     General Gait Details: cues for sequence, gait progression,  RW position   Stairs            Wheelchair Mobility    Modified Rankin (Stroke Patients Only)       Balance                                            Cognition Arousal/Alertness: Awake/alert Behavior During Therapy: WFL for tasks assessed/performed;Flat affect Overall Cognitive Status: Within Functional Limits for tasks assessed                                        Exercises Total Joint Exercises Ankle Circles/Pumps: AROM;Both;10 reps Quad Sets: 10 reps;AROM;Both Short Arc Quad: AROM;Strengthening;Left;10 reps Heel  Slides: AAROM;Left;10 reps Hip ABduction/ADduction: AAROM;Left;10 reps Straight Leg Raises: Strengthening;AROM;Left;10 reps Goniometric ROM: grossly 10* to 80* AAROM knee flexion in sitting    General Comments        Pertinent Vitals/Pain Pain Assessment: 0-10 Pain Score: 8  Pain Location: L knee Pain Descriptors / Indicators: Grimacing;Guarding;Sore Pain Intervention(s): Limited activity within patient's tolerance;Monitored during session;Premedicated before session;Repositioned;Patient requesting pain meds-RN notified;RN gave pain meds during session    Home Living                      Prior Function            PT Goals (current goals can now be found in the care plan section) Acute Rehab PT Goals Patient Stated Goal: home, get better and have less pain PT Goal Formulation: With patient Time For Goal Achievement: 04/08/17 Potential to Achieve Goals: Good Progress towards PT goals: Progressing toward goals    Frequency    7X/week      PT Plan Current plan remains appropriate    Co-evaluation              AM-PAC PT "6 Clicks" Daily Activity  Outcome Measure  Difficulty turning over in bed (including adjusting bedclothes, sheets and blankets)?: Total Difficulty moving from lying  on back to sitting on the side of the bed? : Total Difficulty sitting down on and standing up from a chair with arms (e.g., wheelchair, bedside commode, etc,.)?: Total Help needed moving to and from a bed to chair (including a wheelchair)?: A Little Help needed walking in hospital room?: A Little Help needed climbing 3-5 steps with a railing? : A Little 6 Click Score: 12    End of Session Equipment Utilized During Treatment: Gait belt Activity Tolerance: Patient tolerated treatment well Patient left: in chair;with call bell/phone within reach;with family/visitor present Nurse Communication: Mobility status PT Visit Diagnosis: Pain;Difficulty in walking, not elsewhere  classified (R26.2) Pain - Right/Left: Left Pain - part of body: Knee     Time: 4268-3419 PT Time Calculation (min) (ACUTE ONLY): 27 min  Charges:  $Gait Training: 8-22 mins $Therapeutic Exercise: 8-22 mins                    G Codes:          Willies Laviolette 04/10/2017, 12:58 PM

## 2017-04-03 LAB — CBC
HCT: 30.8 % — ABNORMAL LOW (ref 39.0–52.0)
Hemoglobin: 10.3 g/dL — ABNORMAL LOW (ref 13.0–17.0)
MCH: 28.8 pg (ref 26.0–34.0)
MCHC: 33.4 g/dL (ref 30.0–36.0)
MCV: 86 fL (ref 78.0–100.0)
PLATELETS: 276 10*3/uL (ref 150–400)
RBC: 3.58 MIL/uL — AB (ref 4.22–5.81)
RDW: 12.7 % (ref 11.5–15.5)
WBC: 8 10*3/uL (ref 4.0–10.5)

## 2017-04-03 LAB — GLUCOSE, CAPILLARY: GLUCOSE-CAPILLARY: 154 mg/dL — AB (ref 65–99)

## 2017-04-03 MED ORDER — ASPIRIN 325 MG PO TBEC: 325.0000 mg | DELAYED_RELEASE_TABLET | Freq: Two times a day (BID) | ORAL | 0 refills | Status: DC

## 2017-04-03 MED ORDER — OXYCODONE HCL 5 MG PO TABS: 5.0000 mg | ORAL_TABLET | ORAL | 0 refills | Status: AC | PRN

## 2017-04-03 MED ORDER — METHOCARBAMOL 500 MG PO TABS: 500.0000 mg | ORAL_TABLET | Freq: Four times a day (QID) | ORAL | 0 refills | Status: AC | PRN

## 2017-04-03 NOTE — Discharge Instructions (Signed)
Crutches /walker as needed.  Ice to affected area all times.  Work on active Contractor as outlined

## 2017-04-03 NOTE — Progress Notes (Signed)
Subjective: 3 Days Post-Op Procedure(s) (LRB): LEFT TOTAL KNEE ARTHROPLASTY (Left) Patient reports pain as mild.    Objective: Vital signs in last 24 hours: Temp:  [98 F (36.7 C)] 98 F (36.7 C) (08/13 0656) Pulse Rate:  [79-91] 79 (08/13 0656) Resp:  [15-16] 16 (08/13 0656) BP: (129-173)/(74-95) 129/74 (08/13 0656) SpO2:  [95 %-96 %] 96 % (08/13 0656)  Intake/Output from previous day: 08/12 0701 - 08/13 0700 In: 720 [P.O.:720] Out: 1500 [Urine:1500] Intake/Output this shift: No intake/output data recorded.   Recent Labs  04/01/17 0541 04/02/17 0553 04/03/17 0527  HGB 10.8* 10.4* 10.3*    Recent Labs  04/02/17 0553 04/03/17 0527  WBC 10.2 8.0  RBC 3.61* 3.58*  HCT 30.8* 30.8*  PLT 276 276    Recent Labs  04/01/17 0541  NA 137  K 4.4  CL 101  CO2 28  BUN 16  CREATININE 0.88  GLUCOSE 196*  CALCIUM 8.7*   No results for input(s): LABPT, INR in the last 72 hours.  Neurologically intact Neurovascular intact Sensation intact distally Intact pulses distally No cellulitis present Compartment soft  Assessment/Plan: 3 Days Post-Op Procedure(s) (LRB): LEFT TOTAL KNEE ARTHROPLASTY (Left) Advance diet Up with therapy Discharge home with home health  Ryan Le 04/03/2017, 8:12 AM

## 2017-04-03 NOTE — Progress Notes (Signed)
Physical Therapy Treatment Patient Details Name: Ryan Le MRN: [id number] DOB: Feb 15, 1956 Today's Date: 04/03/2017    History of Present Illness s/p L TKA; PMHx: Parkinson's    PT Comments    Continues to make appropriate gains; will benefit from HHPT  Follow Up Recommendations  DC plan and follow up therapy as arranged by surgeon;Home health PT     Equipment Recommendations  3in1 (PT)    Recommendations for Other Services       Precautions / Restrictions Precautions Precautions: Fall;Knee Precaution Comments: KI not utilized--pt able to do IND SLRs Restrictions Other Position/Activity Restrictions: WBAT    Mobility  Bed Mobility   Bed Mobility: Sit to Supine;Supine to Sit     Supine to sit: Supervision     General bed mobility comments: for safety, incr time  Transfers   Equipment used: Rolling walker (2 wheeled) Transfers: Sit to/from Stand Sit to Stand: Supervision;Min guard         General transfer comment: cues for hand placement,  LLE position and to control descent  Ambulation/Gait Ambulation/Gait assistance: Supervision Ambulation Distance (Feet): 180 Feet Assistive device: Rolling walker (2 wheeled) Gait Pattern/deviations: Step-to pattern;Step-through pattern     General Gait Details: cues for step/stride length, trunk extension, heel strike on L   Stairs            Wheelchair Mobility    Modified Rankin (Stroke Patients Only)       Balance                                            Cognition Arousal/Alertness: Awake/alert Behavior During Therapy: WFL for tasks assessed/performed;Flat affect Overall Cognitive Status: Within Functional Limits for tasks assessed                                        Exercises Total Joint Exercises Ankle Circles/Pumps: AROM;Both;10 reps Quad Sets: 10 reps;AROM;Both Heel Slides: AAROM;Left;10 reps Straight Leg Raises: Strengthening;AROM;Left;10  reps Long Arc Quad: AROM;Left;10 reps;Seated Goniometric ROM: ~ 5* to 85*    General Comments        Pertinent Vitals/Pain Pain Assessment: 0-10 Pain Score: 5  Pain Location: L knee Pain Descriptors / Indicators: Grimacing;Guarding;Sore Pain Intervention(s): Limited activity within patient's tolerance;Monitored during session;Premedicated before session;Ice applied    Home Living                      Prior Function            PT Goals (current goals can now be found in the care plan section) Acute Rehab PT Goals Patient Stated Goal: home, get better and have less pain PT Goal Formulation: With patient Time For Goal Achievement: 04/08/17 Potential to Achieve Goals: Good Progress towards PT goals: Progressing toward goals    Frequency    7X/week      PT Plan Current plan remains appropriate    Co-evaluation              AM-PAC PT "6 Clicks" Daily Activity  Outcome Measure  Difficulty turning over in bed (including adjusting bedclothes, sheets and blankets)?: A Little Difficulty moving from lying on back to sitting on the side of the bed? : A Little Difficulty sitting down on and standing up from  a chair with arms (e.g., wheelchair, bedside commode, etc,.)?: A Little Help needed moving to and from a bed to chair (including a wheelchair)?: A Little Help needed walking in hospital room?: A Little Help needed climbing 3-5 steps with a railing? : A Little 6 Click Score: 18    End of Session   Activity Tolerance: Patient tolerated treatment well Patient left: in chair;with call bell/phone within reach;with family/visitor present   PT Visit Diagnosis: Pain;Difficulty in walking, not elsewhere classified (R26.2) Pain - Right/Left: Left Pain - part of body: Knee     Time: 5329-9242 PT Time Calculation (min) (ACUTE ONLY): 21 min  Charges:  $Gait Training: 8-22 mins                    G CodesKenyon Ana, PT Pager:  [phone] 04/03/2017    Kenyon Ana 04/03/2017, 11:26 AM

## 2017-04-06 NOTE — Discharge Summary (Signed)
Patient ID: Ryan Le MRN: [id number] DOB/AGE: 1955-09-29 61 y.o.  Admit date: 03/31/2017 Discharge date: 04/03/2017  Admission Diagnoses:  Active Problems:   Primary osteoarthritis of left knee   Discharge Diagnoses:  Same  Past Medical History:  Diagnosis Date  . Anxiety   . Arthritis   . Depression   . Diabetes mellitus without complication ([place])   . GERD (gastroesophageal reflux disease)   . Headache    occasionally  . History of kidney stones   . Hyperlipemia   . Hypertension   . Parkinson disease ([place])   . Sleep apnea    no CPAP  uses O2 @ 2 l @night     Surgeries: Procedure(s): LEFT TOTAL KNEE ARTHROPLASTY on 03/31/2017   Consultants:   Discharged Condition: Improved  Hospital Course: Ryan Le is an 61 y.o. male who was admitted 03/31/2017 for operative treatment of<principal problem not specified>. Patient has severe unremitting pain that affects sleep, daily activities, and work/hobbies. After pre-op clearance the patient was taken to the operating room on 03/31/2017 and underwent  Procedure(s): LEFT TOTAL KNEE ARTHROPLASTY.    Patient was given perioperative antibiotics:  Anti-infectives    Start     Dose/Rate Route Frequency Ordered Stop   03/31/17 1330  ceFAZolin (ANCEF) IVPB 2g/100 mL premix     2 g 200 mL/hr over 30 Minutes Intravenous Every 6 hours 03/31/17 1139 03/31/17 2002   03/31/17 0646  ceFAZolin (ANCEF) 2-4 GM/100ML-% IVPB    Comments:  Carleene Cooper   : cabinet override      03/31/17 0646 03/31/17 0724   03/31/17 0529  ceFAZolin (ANCEF) IVPB 2g/100 mL premix     2 g 200 mL/hr over 30 Minutes Intravenous On call to O.R. 03/31/17 4098 03/31/17 0744       Patient was given sequential compression devices, early ambulation, and chemoprophylaxis to prevent DVT.  Patient benefited maximally from hospital stay and there were no complications.    Recent vital signs: No data found.    Recent laboratory studies: No results for  input(s): WBC, HGB, HCT, PLT, NA, K, CL, CO2, BUN, CREATININE, GLUCOSE, INR, CALCIUM in the last 72 hours.  Invalid input(s): PT, 2   Discharge Medications:   Allergies as of 04/03/2017      Reactions   Latex Rash      Medication List    STOP taking these medications   ibuprofen 200 MG tablet Commonly known as:  ADVIL,MOTRIN   naproxen sodium 220 MG tablet Commonly known as:  ANAPROX   OXYGEN     TAKE these medications   acetaminophen 650 MG CR tablet Commonly known as:  TYLENOL Take 1,300 mg by mouth 2 (two) times daily as needed for pain.   aspirin 325 MG EC tablet Take 1 tablet (325 mg total) by mouth 2 (two) times daily after a meal. What changed:  medication strength  how much to take  when to take this   atorvastatin 10 MG tablet Commonly known as:  LIPITOR Take 10 mg by mouth daily.   AZILECT 1 MG Tabs tablet Generic drug:  rasagiline Take 1 mg by mouth daily.   b complex vitamins tablet Take 1 tablet by mouth daily.   BIOFREEZE ROLL-ON EX Apply 1 application topically at bedtime as needed (pain).   carbidopa-levodopa 25-100 MG tablet Commonly known as:  SINEMET IR Take 2 tablets by mouth 3 (three) times daily.   cetirizine 10 MG tablet Commonly known as:  ZYRTEC Take  10 mg by mouth daily as needed for allergies.   citalopram 40 MG tablet Commonly known as:  CELEXA Take 20 mg by mouth 2 (two) times daily.   clonazePAM 0.5 MG tablet Commonly known as:  KLONOPIN Take 0.5-1 mg by mouth at bedtime.   enalapril 20 MG tablet Commonly known as:  VASOTEC Take 20 mg by mouth daily.   Fish Oil 1000 MG Caps Take 1,000 mg by mouth 2 (two) times daily.   glimepiride 4 MG tablet Commonly known as:  AMARYL Take 4 mg by mouth daily with breakfast.   JARDIANCE 10 MG Tabs tablet Generic drug:  empagliflozin Take 10 mg by mouth daily.   LANTUS SOLOSTAR 100 UNIT/ML Solostar Pen Generic drug:  Insulin Glargine Inject 18 Units into the skin  daily.   levocetirizine 5 MG tablet Commonly known as:  XYZAL Take 5 mg by mouth daily as needed for allergies.   metFORMIN 1000 MG tablet Commonly known as:  GLUCOPHAGE Take 1,000 mg by mouth 2 (two) times daily with a meal.   methocarbamol 500 MG tablet Commonly known as:  ROBAXIN Take 1 tablet (500 mg total) by mouth every 6 (six) hours as needed for muscle spasms.   multivitamin with minerals Tabs tablet Take 1 tablet by mouth daily. Men's  50+   omeprazole 40 MG capsule Commonly known as:  PRILOSEC Take 40 mg by mouth daily at 12 noon.   oxyCODONE 5 MG immediate release tablet Commonly known as:  Oxy IR/ROXICODONE Take 1-2 tablets (5-10 mg total) by mouth every 3 (three) hours as needed for breakthrough pain.   rOPINIRole 0.5 MG tablet Commonly known as:  REQUIP Take 0.5 mg by mouth 3 (three) times daily.   VICKS VAPOR INHALER IN Inhale into the lungs as needed (nasal congestion).       Diagnostic Studies: Dg Chest 2 View  Result Date: 03/24/2017 CLINICAL DATA:  Pre-op respiratory exam for total knee replacement. EXAM: CHEST  2 VIEW COMPARISON:  11/23/2016 fluoro FINDINGS: The heart size and mediastinal contours are within normal limits. Both lungs are clear. Several old bilateral rib fracture deformities again noted as well as thoracic spine degenerative changes . IMPRESSION: No active cardiopulmonary disease. Electronically Signed   By: Earle Gell M.D.   On: 03/24/2017 15:10    Disposition: 01-Home or Self Care    Follow-up Information    [facility] [place] Follow up.   Why:  Home Health Physical Therapy Contact information: [place] [address] [phone]        Dorna Leitz, MD Follow up in 2 week(s).   Specialty:  Orthopedic Surgery Contact information: [place] [address] [phone]            Signed: Erlene Senters 04/06/2017, 8:53 AM

## 2017-09-28 ENCOUNTER — Encounter (INDEPENDENT_AMBULATORY_CARE_PROVIDER_SITE_OTHER): Payer: Self-pay | Admitting: *Deleted

## 2017-09-28 ENCOUNTER — Ambulatory Visit (INDEPENDENT_AMBULATORY_CARE_PROVIDER_SITE_OTHER): Payer: Medicare PPO | Admitting: Internal Medicine

## 2017-09-28 ENCOUNTER — Telehealth (INDEPENDENT_AMBULATORY_CARE_PROVIDER_SITE_OTHER): Payer: Self-pay | Admitting: *Deleted

## 2017-09-28 ENCOUNTER — Encounter (INDEPENDENT_AMBULATORY_CARE_PROVIDER_SITE_OTHER): Payer: Self-pay | Admitting: Internal Medicine

## 2017-09-28 VITALS — BP 150/82 | HR 72 | Temp 98.0°F | Ht 72.0 in | Wt 243.7 lb

## 2017-09-28 DIAGNOSIS — Z800 Family history of malignant neoplasm of digestive organs: Secondary | ICD-10-CM | POA: Diagnosis not present

## 2017-09-28 MED ORDER — PEG 3350-KCL-NA BICARB-NACL 420 G PO SOLR: 4000.0000 mL | Freq: Once | ORAL | 0 refills | Status: AC

## 2017-09-28 NOTE — Patient Instructions (Signed)
Colonoscopy. The risks of bleeding, perforation and infection were reviewed with patient.  

## 2017-09-28 NOTE — Telephone Encounter (Signed)
Patient needs trilyte 

## 2017-09-28 NOTE — Progress Notes (Addendum)
Subjective:    Patient ID: Ryan Le, male    DOB: 12-02-1955, 62 y.o.   MRN: [id number]  HPI Referred by Dr. Tawny Asal for C-diff.  Last bout of C-diff was in January and covered with Flagyl x x 10 days. Covered with Vancomycin 250mg  qid x 14 days in November of 2018.  Hx of same.  Has had 5 episodes of C diff in the past.  First episode was in 2014.  He says with the C diff he became septic. He had visited his cousin in the nursing home and developed C diff.  He has had 5 episodes of C-diff. Since 2014.   His stools are normal at this time.  Appetite is good. No weight loss.  His last colonoscopy was in 2013. Hx of polyps. Sessile polyp behind the fold in the rt colon removed. Diverticulosis. No other polyps. Biopsy: Hyperplastic polyp Family hx of colon cancer in a father age 92. Hx of DM since 2015.., Parkinson in 2012. He does eat Probiotics.   Review of Systems Past Medical History:  Diagnosis Date  . Anxiety   . Arthritis   . Depression   . Diabetes mellitus without complication ([place])   . GERD (gastroesophageal reflux disease)   . Headache    occasionally  . History of kidney stones   . Hyperlipemia   . Hypertension   . Parkinson disease ([place])   . Sleep apnea    no CPAP  uses O2 @ 2 l @night     Past Surgical History:  Procedure Laterality Date  . arthroscopic knee  Left   . HERNIA REPAIR     umbilical  . kidney stone removal    . ROTATOR CUFF REPAIR Bilateral   . TONSILLECTOMY    . TOTAL KNEE ARTHROPLASTY Left 03/31/2017   Procedure: LEFT TOTAL KNEE ARTHROPLASTY;  Surgeon: Dorna Leitz, MD;  Location: [facility] ORS;  Service: Orthopedics;  Laterality: Left;  . WISDOM TOOTH EXTRACTION      Allergies  Allergen Reactions  . Latex Rash    Current Outpatient Medications on File Prior to Visit  Medication Sig Dispense Refill  . acetaminophen (TYLENOL) 650 MG CR tablet Take 1,300 mg by mouth 2 (two) times daily as needed for pain.    . Aromatic Inhalants (VICKS VAPOR  INHALER IN) Inhale into the lungs as needed (nasal congestion).    Marland Kitchen aspirin EC 325 MG EC tablet Take 1 tablet (325 mg total) by mouth 2 (two) times daily after a meal. 30 tablet 0  . atorvastatin (LIPITOR) 10 MG tablet Take 10 mg by mouth daily.    Marland Kitchen b complex vitamins tablet Take 1 tablet by mouth daily.    . carbidopa-levodopa (SINEMET IR) 25-100 MG tablet Take 2 tablets by mouth 3 (three) times daily.    . cetirizine (ZYRTEC) 10 MG tablet Take 10 mg by mouth daily as needed for allergies.    . citalopram (CELEXA) 40 MG tablet Take 20 mg by mouth 2 (two) times daily.    . clonazePAM (KLONOPIN) 0.5 MG tablet Take 0.5-1 mg by mouth at bedtime.     . enalapril (VASOTEC) 20 MG tablet Take 20 mg by mouth daily.    Marland Kitchen glimepiride (AMARYL) 4 MG tablet Take 4 mg by mouth daily with breakfast.    . JARDIANCE 10 MG TABS tablet Take 10 mg by mouth daily.    Marland Kitchen LANTUS SOLOSTAR 100 UNIT/ML Solostar Pen Inject 18 Units into the skin daily.    Marland Kitchen  levocetirizine (XYZAL) 5 MG tablet Take 5 mg by mouth daily as needed for allergies.    . Menthol, Topical Analgesic, (BIOFREEZE ROLL-ON EX) Apply 1 application topically at bedtime as needed (pain).    . metFORMIN (GLUCOPHAGE) 1000 MG tablet Take 1,000 mg by mouth 2 (two) times daily with a meal.    . methocarbamol (ROBAXIN) 500 MG tablet Take 1 tablet (500 mg total) by mouth every 6 (six) hours as needed for muscle spasms. 60 tablet 0  . Multiple Vitamin (MULTIVITAMIN WITH MINERALS) TABS tablet Take 1 tablet by mouth daily. Men's  50+    . Omega-3 Fatty Acids (FISH OIL) 1000 MG CAPS Take 1,000 mg by mouth 2 (two) times daily.    Marland Kitchen omeprazole (PRILOSEC) 40 MG capsule Take 40 mg by mouth daily at 12 noon.    Marland Kitchen oxyCODONE (OXY IR/ROXICODONE) 5 MG immediate release tablet Take 1-2 tablets (5-10 mg total) by mouth every 3 (three) hours as needed for breakthrough pain. 40 tablet 0  . rasagiline (AZILECT) 1 MG TABS tablet Take 1 mg by mouth daily.    Marland Kitchen rOPINIRole (REQUIP)  0.5 MG tablet Take 0.5 mg by mouth 3 (three) times daily.     No current facility-administered medications on file prior to visit.         Objective:   Physical Exam Blood pressure (!) 150/82, pulse 72, temperature 98 F (36.7 C), height 6' (1.829 m), weight 243 lb 11.2 oz (110.5 kg). Alert and oriented. Skin warm and dry. Oral mucosa is moist.   . Sclera anicteric, conjunctivae is pink. Thyroid not enlarged. No cervical lymphadenopathy. Lungs clear. Heart regular rate and rhythm.  Abdomen is soft. Bowel sounds are positive. No hepatomegaly. No abdominal masses felt. No tenderness.  No edema to lower extremities.           Assessment & Plan:  Family hx of colon cancer: hx of colon polyp Colonoscopy. C-diff: stools are normal at this time. May consider fecal implant.

## 2017-11-30 ENCOUNTER — Other Ambulatory Visit: Payer: Self-pay

## 2017-11-30 ENCOUNTER — Encounter (HOSPITAL_COMMUNITY)
Admission: RE | Disposition: A | Discharge status: Home / Self Care | Payer: Self-pay | Source: Ambulatory Visit | Attending: Internal Medicine

## 2017-11-30 ENCOUNTER — Encounter (HOSPITAL_COMMUNITY): Payer: Self-pay | Admitting: *Deleted

## 2017-11-30 ENCOUNTER — Ambulatory Visit (HOSPITAL_COMMUNITY)
Admission: RE | Admit: 2017-11-30 | Discharge: 2017-11-30 | Disposition: A | Discharge status: Home / Self Care | Payer: Medicare PPO | Source: Ambulatory Visit | Attending: Internal Medicine | Admitting: Internal Medicine

## 2017-11-30 DIAGNOSIS — M1990 Unspecified osteoarthritis, unspecified site: Secondary | ICD-10-CM | POA: Insufficient documentation

## 2017-11-30 DIAGNOSIS — F329 Major depressive disorder, single episode, unspecified: Secondary | ICD-10-CM | POA: Insufficient documentation

## 2017-11-30 DIAGNOSIS — D128 Benign neoplasm of rectum: Secondary | ICD-10-CM | POA: Insufficient documentation

## 2017-11-30 DIAGNOSIS — I10 Essential (primary) hypertension: Secondary | ICD-10-CM | POA: Insufficient documentation

## 2017-11-30 DIAGNOSIS — Z87442 Personal history of urinary calculi: Secondary | ICD-10-CM | POA: Insufficient documentation

## 2017-11-30 DIAGNOSIS — G4730 Sleep apnea, unspecified: Secondary | ICD-10-CM | POA: Insufficient documentation

## 2017-11-30 DIAGNOSIS — D123 Benign neoplasm of transverse colon: Secondary | ICD-10-CM | POA: Diagnosis not present

## 2017-11-30 DIAGNOSIS — Z7982 Long term (current) use of aspirin: Secondary | ICD-10-CM | POA: Insufficient documentation

## 2017-11-30 DIAGNOSIS — K644 Residual hemorrhoidal skin tags: Secondary | ICD-10-CM | POA: Insufficient documentation

## 2017-11-30 DIAGNOSIS — K5730 Diverticulosis of large intestine without perforation or abscess without bleeding: Secondary | ICD-10-CM | POA: Diagnosis not present

## 2017-11-30 DIAGNOSIS — Z87891 Personal history of nicotine dependence: Secondary | ICD-10-CM | POA: Insufficient documentation

## 2017-11-30 DIAGNOSIS — Z1211 Encounter for screening for malignant neoplasm of colon: Secondary | ICD-10-CM | POA: Diagnosis present

## 2017-11-30 DIAGNOSIS — F419 Anxiety disorder, unspecified: Secondary | ICD-10-CM | POA: Diagnosis not present

## 2017-11-30 DIAGNOSIS — G20 Parkinson's disease: Secondary | ICD-10-CM | POA: Diagnosis not present

## 2017-11-30 DIAGNOSIS — Z794 Long term (current) use of insulin: Secondary | ICD-10-CM | POA: Insufficient documentation

## 2017-11-30 DIAGNOSIS — E119 Type 2 diabetes mellitus without complications: Secondary | ICD-10-CM | POA: Insufficient documentation

## 2017-11-30 DIAGNOSIS — Z9981 Dependence on supplemental oxygen: Secondary | ICD-10-CM | POA: Insufficient documentation

## 2017-11-30 DIAGNOSIS — E785 Hyperlipidemia, unspecified: Secondary | ICD-10-CM | POA: Diagnosis not present

## 2017-11-30 DIAGNOSIS — Z800 Family history of malignant neoplasm of digestive organs: Secondary | ICD-10-CM | POA: Diagnosis not present

## 2017-11-30 DIAGNOSIS — Z86010 Personal history of colonic polyps: Secondary | ICD-10-CM | POA: Insufficient documentation

## 2017-11-30 DIAGNOSIS — K219 Gastro-esophageal reflux disease without esophagitis: Secondary | ICD-10-CM | POA: Diagnosis not present

## 2017-11-30 DIAGNOSIS — Z79899 Other long term (current) drug therapy: Secondary | ICD-10-CM | POA: Insufficient documentation

## 2017-11-30 DIAGNOSIS — Z8619 Personal history of other infectious and parasitic diseases: Secondary | ICD-10-CM | POA: Insufficient documentation

## 2017-11-30 DIAGNOSIS — Z96652 Presence of left artificial knee joint: Secondary | ICD-10-CM | POA: Insufficient documentation

## 2017-11-30 DIAGNOSIS — K648 Other hemorrhoids: Secondary | ICD-10-CM | POA: Insufficient documentation

## 2017-11-30 DIAGNOSIS — Z91040 Latex allergy status: Secondary | ICD-10-CM | POA: Insufficient documentation

## 2017-11-30 DIAGNOSIS — Z09 Encounter for follow-up examination after completed treatment for conditions other than malignant neoplasm: Secondary | ICD-10-CM | POA: Diagnosis not present

## 2017-11-30 DIAGNOSIS — K621 Rectal polyp: Secondary | ICD-10-CM

## 2017-11-30 HISTORY — PX: COLONOSCOPY: SHX5424

## 2017-11-30 HISTORY — DX: Other bacterial infections of unspecified site: A49.8

## 2017-11-30 LAB — GLUCOSE, CAPILLARY: GLUCOSE-CAPILLARY: 139 mg/dL — AB (ref 65–99)

## 2017-11-30 SURGERY — COLONOSCOPY
Anesthesia: Moderate Sedation

## 2017-11-30 MED ORDER — MEPERIDINE HCL 50 MG/ML IJ SOLN
INTRAMUSCULAR | Status: DC | PRN
  Administered 2017-11-30 (×2): 25 mg via INTRAVENOUS

## 2017-11-30 MED ORDER — SODIUM CHLORIDE 0.9 % IV SOLN
INTRAVENOUS | Status: DC
  Administered 2017-11-30: 11:00:00 via INTRAVENOUS

## 2017-11-30 MED ORDER — MIDAZOLAM HCL 5 MG/5ML IJ SOLN
INTRAMUSCULAR | Status: AC
  Filled 2017-11-30: qty 10

## 2017-11-30 MED ORDER — MEPERIDINE HCL 50 MG/ML IJ SOLN
INTRAMUSCULAR | Status: AC
  Filled 2017-11-30: qty 1

## 2017-11-30 MED ORDER — MIDAZOLAM HCL 5 MG/5ML IJ SOLN
INTRAMUSCULAR | Status: DC | PRN
  Administered 2017-11-30 (×3): 2 mg via INTRAVENOUS

## 2017-11-30 MED ORDER — STERILE WATER FOR IRRIGATION IR SOLN
Status: DC | PRN
  Administered 2017-11-30: 100 mL

## 2017-11-30 NOTE — Discharge Instructions (Signed)
No aspirin or NSAIDs for 1 week. Resume other medications as before. Modified carb high fiber diet. No driving for 24 hours. Physician will call with biopsy results.   Colonoscopy, Adult, Care After This sheet gives you information about how to care for yourself after your procedure. Your health care provider may also give you more specific instructions. If you have problems or questions, contact your health care provider. Dr Laural Golden:  [phone]; after hours and weekends, call hospital and have GI doctor on call paged; they will call you back.  What can I expect after the procedure? After the procedure, it is common to have:  A small amount of blood in your stool for 24 hours after the procedure.  Some gas.  Mild abdominal cramping or bloating.  Follow these instructions at home: General instructions   For the first 24 hours after the procedure: ? Do not drive or use machinery. ? Do not sign important documents. ? Do not drink alcohol. ? Rest often.  Take over-the-counter or prescription medicines only as told by your health care provider.  It is up to you to get the results of your procedure. Ask your health care provider, or the department performing the procedure, when your results will be ready. Relieving cramping and bloating  Try walking around when you have cramps or feel bloated.   Eating and drinking  Drink enough fluid to keep your urine clear or pale yellow.  Resume your normal diet as instructed by your health care provider.    Contact a health care provider if:  You have blood in your stool 2-3 days after the procedure. Get help right away if:  You have more than a small spotting of blood in your stool.  You pass large blood clots in your stool.  Your abdomen is swollen.  You have nausea or vomiting.  You have a fever.  You have increasing abdominal pain that is not relieved with medicine. This information is not intended to replace advice  given to you by your health care provider. Make sure you discuss any questions you have with your health care provider. Document Released: 03/22/2004 Document Revised: 05/02/2016 Document Reviewed: 10/20/2015 Elsevier Interactive Patient Education  [employer].

## 2017-11-30 NOTE — Op Note (Signed)
[facility] Patient Name: Ryan Le Procedure Date: 11/30/2017 11:42 AM MRN: [id number] Date of Birth: August 26, 1955 Attending MD: Hildred Laser , MD CSN: [id number] Age: 62 Admit Type: Outpatient Procedure:                Colonoscopy Indications:              High risk colon cancer surveillance: Personal                            history of colonic polyps, Family history of colon                            cancer in a first-degree relative Providers:                Hildred Laser, MD, Rosina Lowenstein, RN, Nelma Rothman,                            Technician Referring MD:             Pat Kocher, MD Medicines:                Meperidine 50 mg IV, Midazolam 7 mg IV Complications:            No immediate complications. Estimated Blood Loss:     Estimated blood loss was minimal. Procedure:                Pre-Anesthesia Assessment:                           - Prior to the procedure, a History and Physical                            was performed, and patient medications and                            allergies were reviewed. The patient's tolerance of                            previous anesthesia was also reviewed. The risks                            and benefits of the procedure and the sedation                            options and risks were discussed with the patient.                            All questions were answered, and informed consent                            was obtained. Prior Anticoagulants: The patient                            last took aspirin 3 days prior to the procedure.  ASA Grade Assessment: III - A patient with severe                            systemic disease. After reviewing the risks and                            benefits, the patient was deemed in satisfactory                            condition to undergo the procedure.                           After obtaining informed consent, the colonoscope                            was  passed under direct vision. Throughout the                            procedure, the patient's blood pressure, pulse, and                            oxygen saturations were monitored continuously. The                            EC-[id number] ([id number]) scope was introduced through                            the anus and advanced to the the cecum, identified                            by appendiceal orifice and ileocecal valve. The                            colonoscopy was performed without difficulty. The                            patient tolerated the procedure well. The quality                            of the bowel preparation was adequate. The                            ileocecal valve, appendiceal orifice, and rectum                            were photographed. Scope In: 11:54:17 AM Scope Out: 12:23:45 PM Scope Withdrawal Time: 0 hours 21 minutes 50 seconds  Total Procedure Duration: 0 hours 29 minutes 28 seconds  Findings:      The perianal and digital rectal examinations were normal.      Scattered medium-mouthed diverticula were found in the sigmoid colon and       transverse colon.      A small polyp was found in the distal transverse colon. The polyp was       sessile. The  polyp was removed with a cold snare. Resection and       retrieval were complete. The pathology specimen was placed into Bottle       Number 1.      A 15 mm polyp was found in the rectum. The polyp was multi-lobulated.       The polyp was removed with a piecemeal technique using a hot snare.       Resection and retrieval were complete. The pathology specimen was placed       into Bottle Number 2.      External and internal hemorrhoids were found during retroflexion. The       hemorrhoids were small. Impression:               - Diverticulosis in the sigmoid colon and in the                            transverse colon.                           - One small polyp in the distal transverse colon,                             removed with a cold snare. Resected and retrieved.                           - One 15 mm polyp in the rectum, removed piecemeal                            using a hot snare. Resected and retrieved.                           - External and internal hemorrhoids. Moderate Sedation:      Moderate (conscious) sedation was administered by the endoscopy nurse       and supervised by the endoscopist. The following parameters were       monitored: oxygen saturation, heart rate, blood pressure, CO2       capnography and response to care. Total physician intraservice time was       37 minutes. Recommendation:           - Patient has a contact number available for                            emergencies. The signs and symptoms of potential                            delayed complications were discussed with the                            patient. Return to normal activities tomorrow.                            Written discharge instructions were provided to the                            patient.                           -  High fiber diet and diabetic (ADA) diet today.                           - Continue present medications.                           - No aspirin, ibuprofen, naproxen, or other                            non-steroidal anti-inflammatory drugs for 7 days                            after polyp removal.                           - Await pathology results.                           - Repeat colonoscopy for surveillance based on                            pathology results. Procedure Code(s):        --- Professional ---                           [id number], Colonoscopy, flexible; with removal of                            tumor(s), polyp(s), or other lesion(s) by snare                            technique                           G0500, Moderate sedation services provided by the                            same physician or other qualified health care                             professional performing a gastrointestinal                            endoscopic service that sedation supports,                            requiring the presence of an independent trained                            observer to assist in the monitoring of the                            patient's level of consciousness and physiological                            status; initial 15 minutes of intra-service time;  patient age 38 years or older (additional time may                            be reported with [phone], as appropriate)                           [phone], Moderate sedation services provided by the                            same physician or other qualified health care                            professional performing the diagnostic or                            therapeutic service that the sedation supports,                            requiring the presence of an independent trained                            observer to assist in the monitoring of the                            patient's level of consciousness and physiological                            status; each additional 15 minutes intraservice                            time (List separately in addition to code for                            primary service) Diagnosis Code(s):        --- Professional ---                           Z86.010, Personal history of colonic polyps                           K64.8, Other hemorrhoids                           D12.3, Benign neoplasm of transverse colon (hepatic                            flexure or splenic flexure)                           K62.1, Rectal polyp                           Z80.0, Family history of malignant neoplasm of                            digestive  organs                           K57.30, Diverticulosis of large intestine without                            perforation or abscess without bleeding CPT copyright 2017 American Medical  Association. All rights reserved. The codes documented in this report are preliminary and upon coder review may  be revised to meet current compliance requirements. Hildred Laser, MD Hildred Laser, MD 11/30/2017 12:36:34 PM This report has been signed electronically. Number of Addenda: 0

## 2017-11-30 NOTE — H&P (Signed)
Ryan Le is an 62 y.o. male.   Chief Complaint: Patient is here for colonoscopy. HPI: Patient is 62 year old Caucasian male with history of colonic adenoma and is here for surveillance colonoscopy.  Adenoma was found in his first exam.  He had known adenomatous polyp on his last exam of December 2013. He has a history of C. difficile colitis.  First episode occurred when he took antibiotic in 2014.  Second episode occurred when he had knee surgery and took antibiotic.  He does not remember what triggered the third episode.  He had another episode in January this year.  He does not believe he took any antibiotics.  He has been having formed stools for the last 2 months.  He denies abdominal pain or rectal bleeding. History significant for CRC and father who was diagnosed at age 22.  He is doing well.  Past Medical History:  Diagnosis Date  . Anxiety   . Arthritis   . Clostridium difficile infection 08/2017, 06/2017  . Depression   . Diabetes mellitus without complication ([place])   . GERD (gastroesophageal reflux disease)   . Headache    occasionally  . History of kidney stones   . Hyperlipemia   . Hypertension   . Parkinson disease ([place])   . Sleep apnea    no CPAP  uses O2 @ 2 l @night     Past Surgical History:  Procedure Laterality Date  . arthroscopic knee  Left   . HERNIA REPAIR     umbilical  . kidney stone removal    . ROTATOR CUFF REPAIR Bilateral   . TONSILLECTOMY    . TOTAL KNEE ARTHROPLASTY Left 03/31/2017   Procedure: LEFT TOTAL KNEE ARTHROPLASTY;  Surgeon: Dorna Leitz, MD;  Location: [facility]S;  Service: Orthopedics;  Laterality: Left;  . WISDOM TOOTH EXTRACTION      Family History  Problem Relation Age of Onset  . Colon cancer Father    Social History:  reports that he quit smoking about 26 years ago. His smoking use included cigarettes. He has a 15.00 pack-year smoking history. He has never used smokeless tobacco. He reports that he drinks alcohol. He reports  that he does not use drugs.  Allergies:  Allergies  Allergen Reactions  . Latex Rash    Medications Prior to Admission  Medication Sig Dispense Refill  . aspirin EC 81 MG tablet Take 81 mg by mouth daily.    Marland Kitchen atorvastatin (LIPITOR) 10 MG tablet Take 10 mg by mouth daily.    Marland Kitchen b complex vitamins tablet Take 1 tablet by mouth daily.    . carbidopa-levodopa (SINEMET IR) 25-100 MG tablet Take 2 tablets by mouth 3 (three) times daily.    . citalopram (CELEXA) 40 MG tablet Take 20 mg by mouth 2 (two) times daily.    . empagliflozin (JARDIANCE) 10 MG TABS tablet Take 10 mg by mouth daily.    . enalapril (VASOTEC) 20 MG tablet Take 20 mg by mouth daily.    Marland Kitchen glimepiride (AMARYL) 4 MG tablet Take 4 mg by mouth daily with breakfast.    . LANTUS SOLOSTAR 100 UNIT/ML Solostar Pen Inject 13 Units into the skin daily.     Marland Kitchen levocetirizine (XYZAL) 5 MG tablet Take 5 mg by mouth at bedtime.    . metFORMIN (GLUCOPHAGE) 1000 MG tablet Take 1,000 mg by mouth 2 (two) times daily with a meal.    . nortriptyline (PAMELOR) 25 MG capsule Take 25 mg by mouth at  bedtime.    . Omega-3 Fatty Acids (FISH OIL) 1000 MG CAPS Take 1,000 mg by mouth 2 (two) times daily.    Marland Kitchen omeprazole (PRILOSEC) 40 MG capsule Take 40 mg by mouth daily.    Marland Kitchen rOPINIRole (REQUIP) 0.5 MG tablet Take 0.5 mg by mouth 3 (three) times daily.  2  . Aromatic Inhalants (VICKS VAPOINHALER) INHA Place 1 puff into both nostrils as needed (for congestion).    . methocarbamol (ROBAXIN) 500 MG tablet Take 1 tablet (500 mg total) by mouth every 6 (six) hours as needed for muscle spasms. 60 tablet 0  . oxyCODONE (OXY IR/ROXICODONE) 5 MG immediate release tablet Take 1-2 tablets (5-10 mg total) by mouth every 3 (three) hours as needed for breakthrough pain. 40 tablet 0    Results for orders placed or performed during the hospital encounter of 11/30/17 (from the past 48 hour(s))  Glucose, capillary     Status: Abnormal   Collection Time: 11/30/17 10:58  AM  Result Value Ref Range   Glucose-Capillary 139 (H) 65 - 99 mg/dL   No results found.  ROS  Blood pressure 126/79, pulse 84, temperature 98.2 F (36.8 C), temperature source Oral, resp. rate 19, height 6' (1.829 m), weight 244 lb (110.7 kg), SpO2 99 %. Physical Exam  Constitutional: He appears well-developed and well-nourished.  HENT:  Mouth/Throat: Oropharynx is clear and moist.  Eyes: Conjunctivae are normal. No scleral icterus.  Neck: No thyromegaly present.  Cardiovascular: Normal rate, regular rhythm and normal heart sounds.  No murmur heard. Respiratory: Effort normal and breath sounds normal.  GI:  Abdomen is symmetrical.  Small umbilical hernia noted.  It is soft and partially reducible.  No organomegaly or masses.  Lymphadenopathy:    He has no cervical adenopathy.     Assessment/Plan History of colonic adenoma. Family history of CRC in first-degree relative at late onset. Surveillance colonoscopy.  Hildred Laser, MD 11/30/2017, 11:40 AM

## 2017-12-06 ENCOUNTER — Encounter (HOSPITAL_COMMUNITY): Payer: Self-pay | Admitting: Internal Medicine

## 2018-10-08 IMAGING — CR DG CHEST 2V
2 series · 2 of 2 positions shown · non-contrast
Comparison: None in PACs

CLINICAL DATA: Preoperative examination prior to left knee
replacement

EXAM:
CHEST  2 VIEW

[w chest pa]
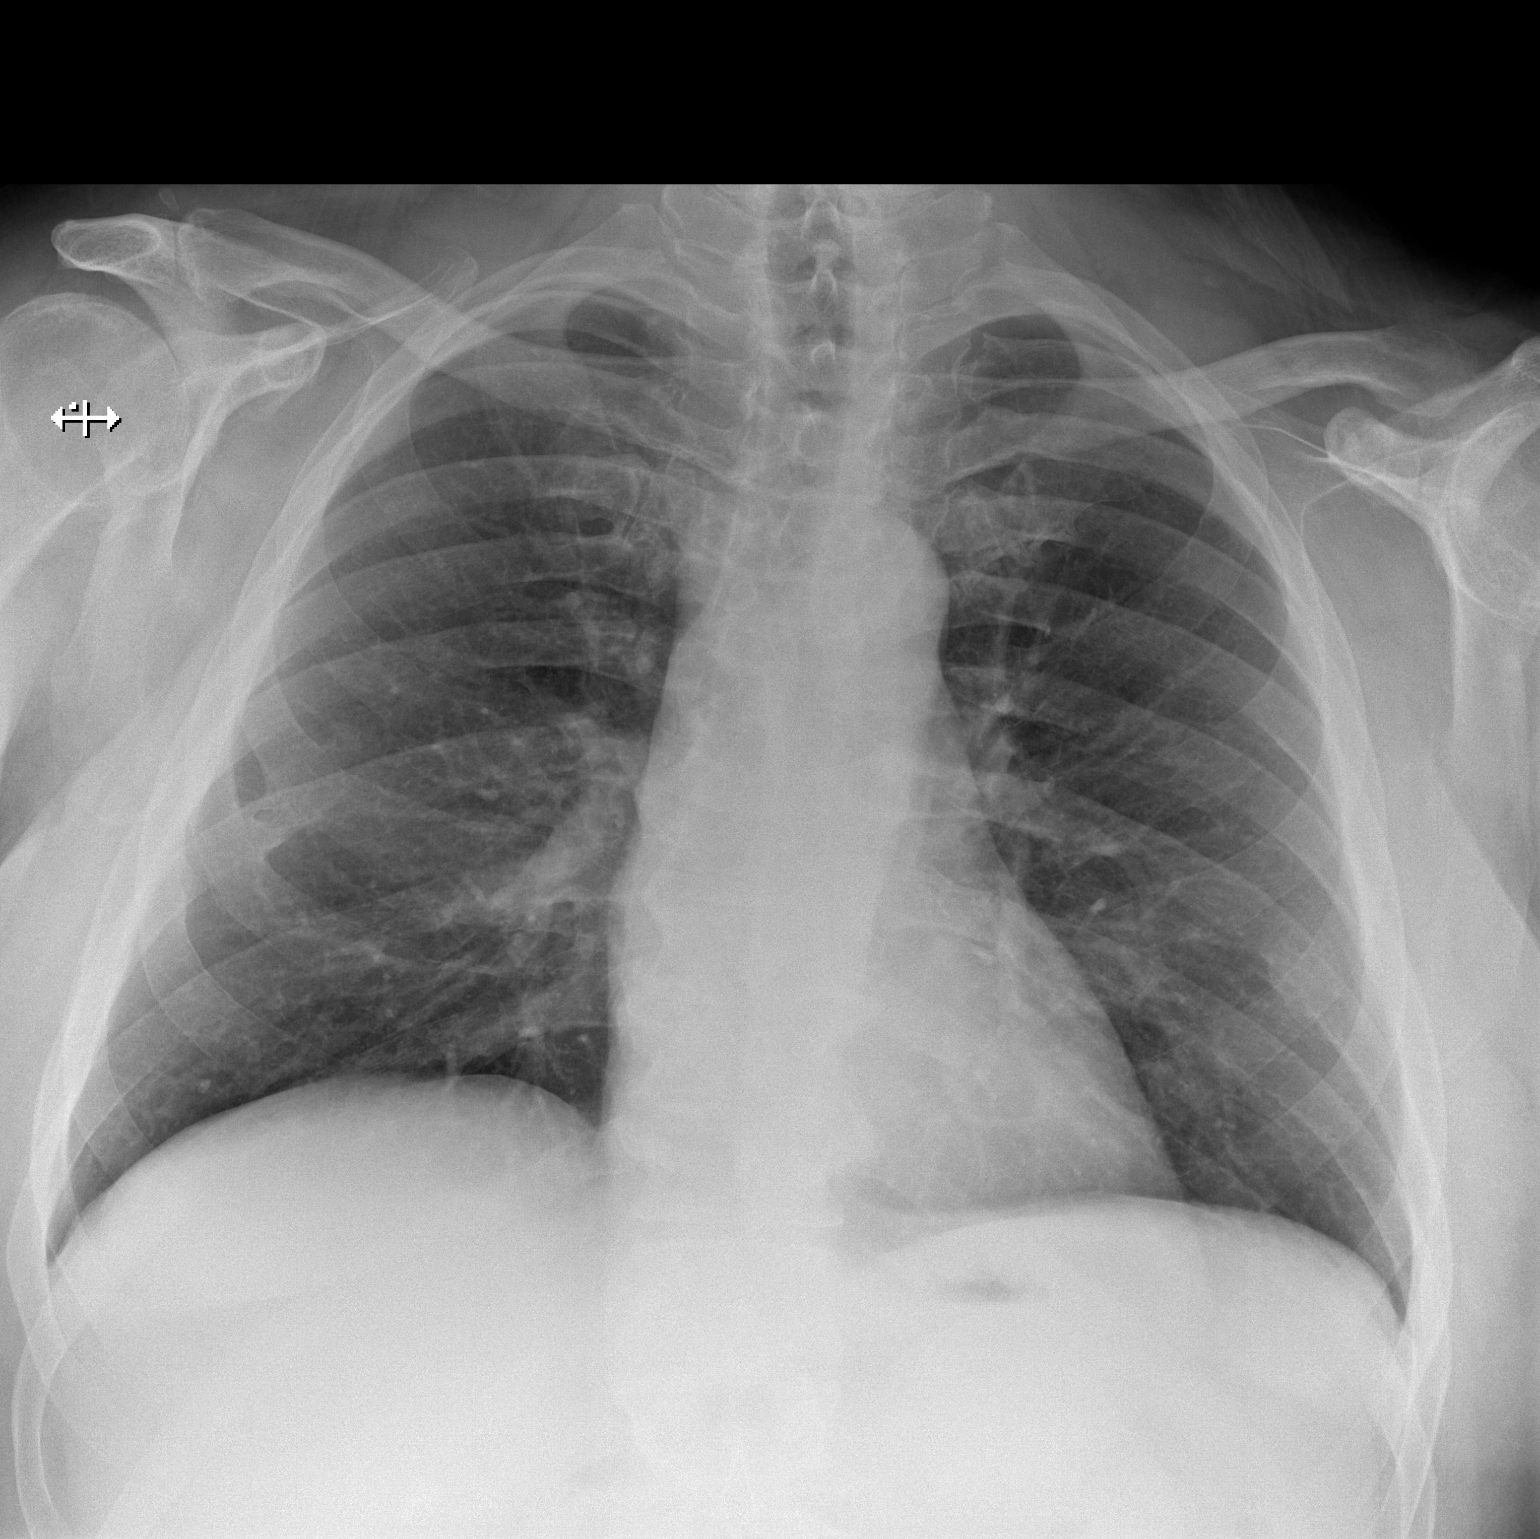

[w chest lat]
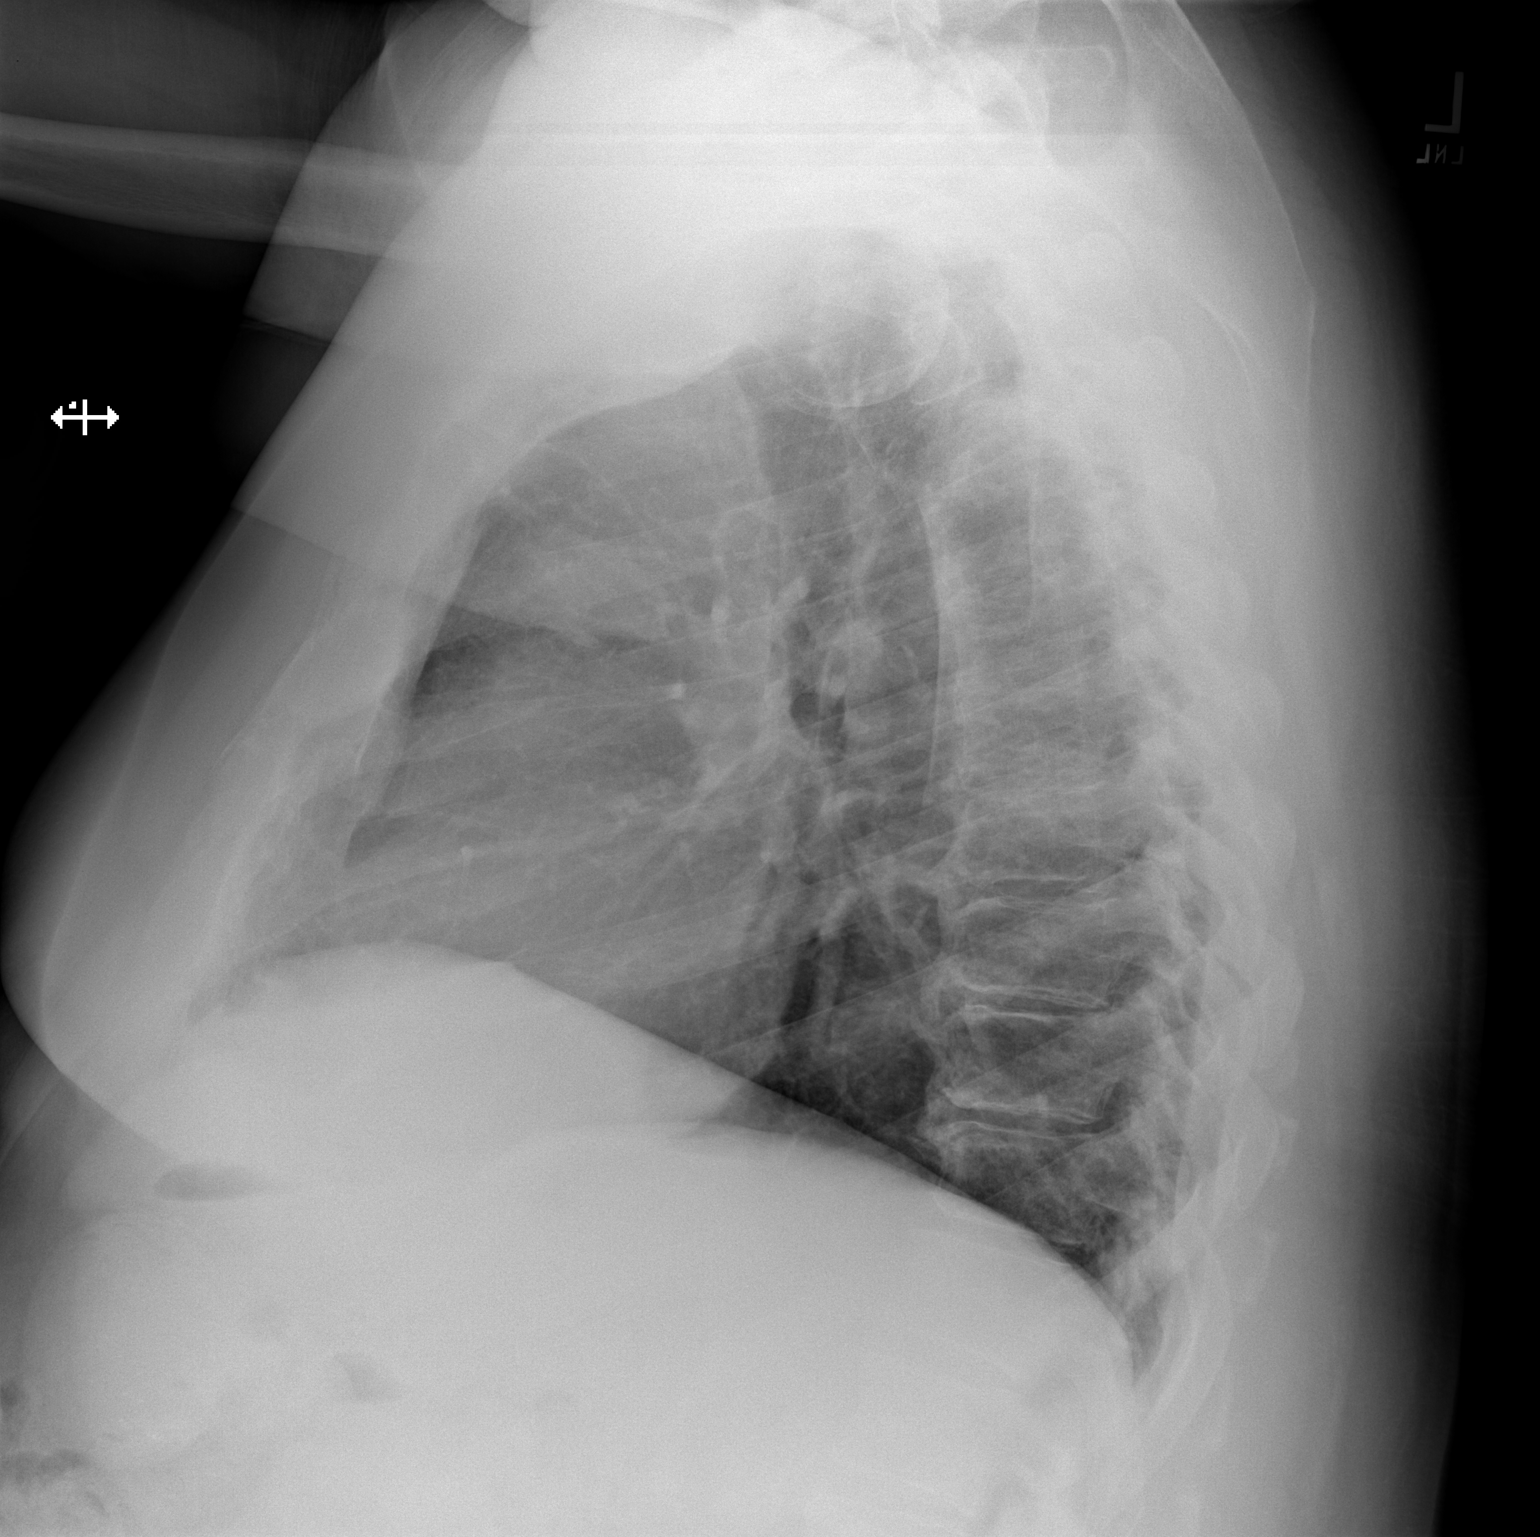

[2 of 2 positions shown; findings below may reference images not displayed]

FINDINGS: The lungs are adequately inflated. There is no focal infiltrate.
There is no pleural effusion. The heart and pulmonary vascularity
are normal. The mediastinum is normal in width. The bony thorax
exhibits no acute abnormality.
IMPRESSION: There is no active cardiopulmonary disease.

## 2019-02-06 IMAGING — DX DG CHEST 2V
2 series · 2 of 2 positions shown · non-contrast
Comparison: 11/23/2016 fluoro

CLINICAL DATA: Pre-op respiratory exam for total knee replacement.

EXAM:
CHEST  2 VIEW

[chest pa]
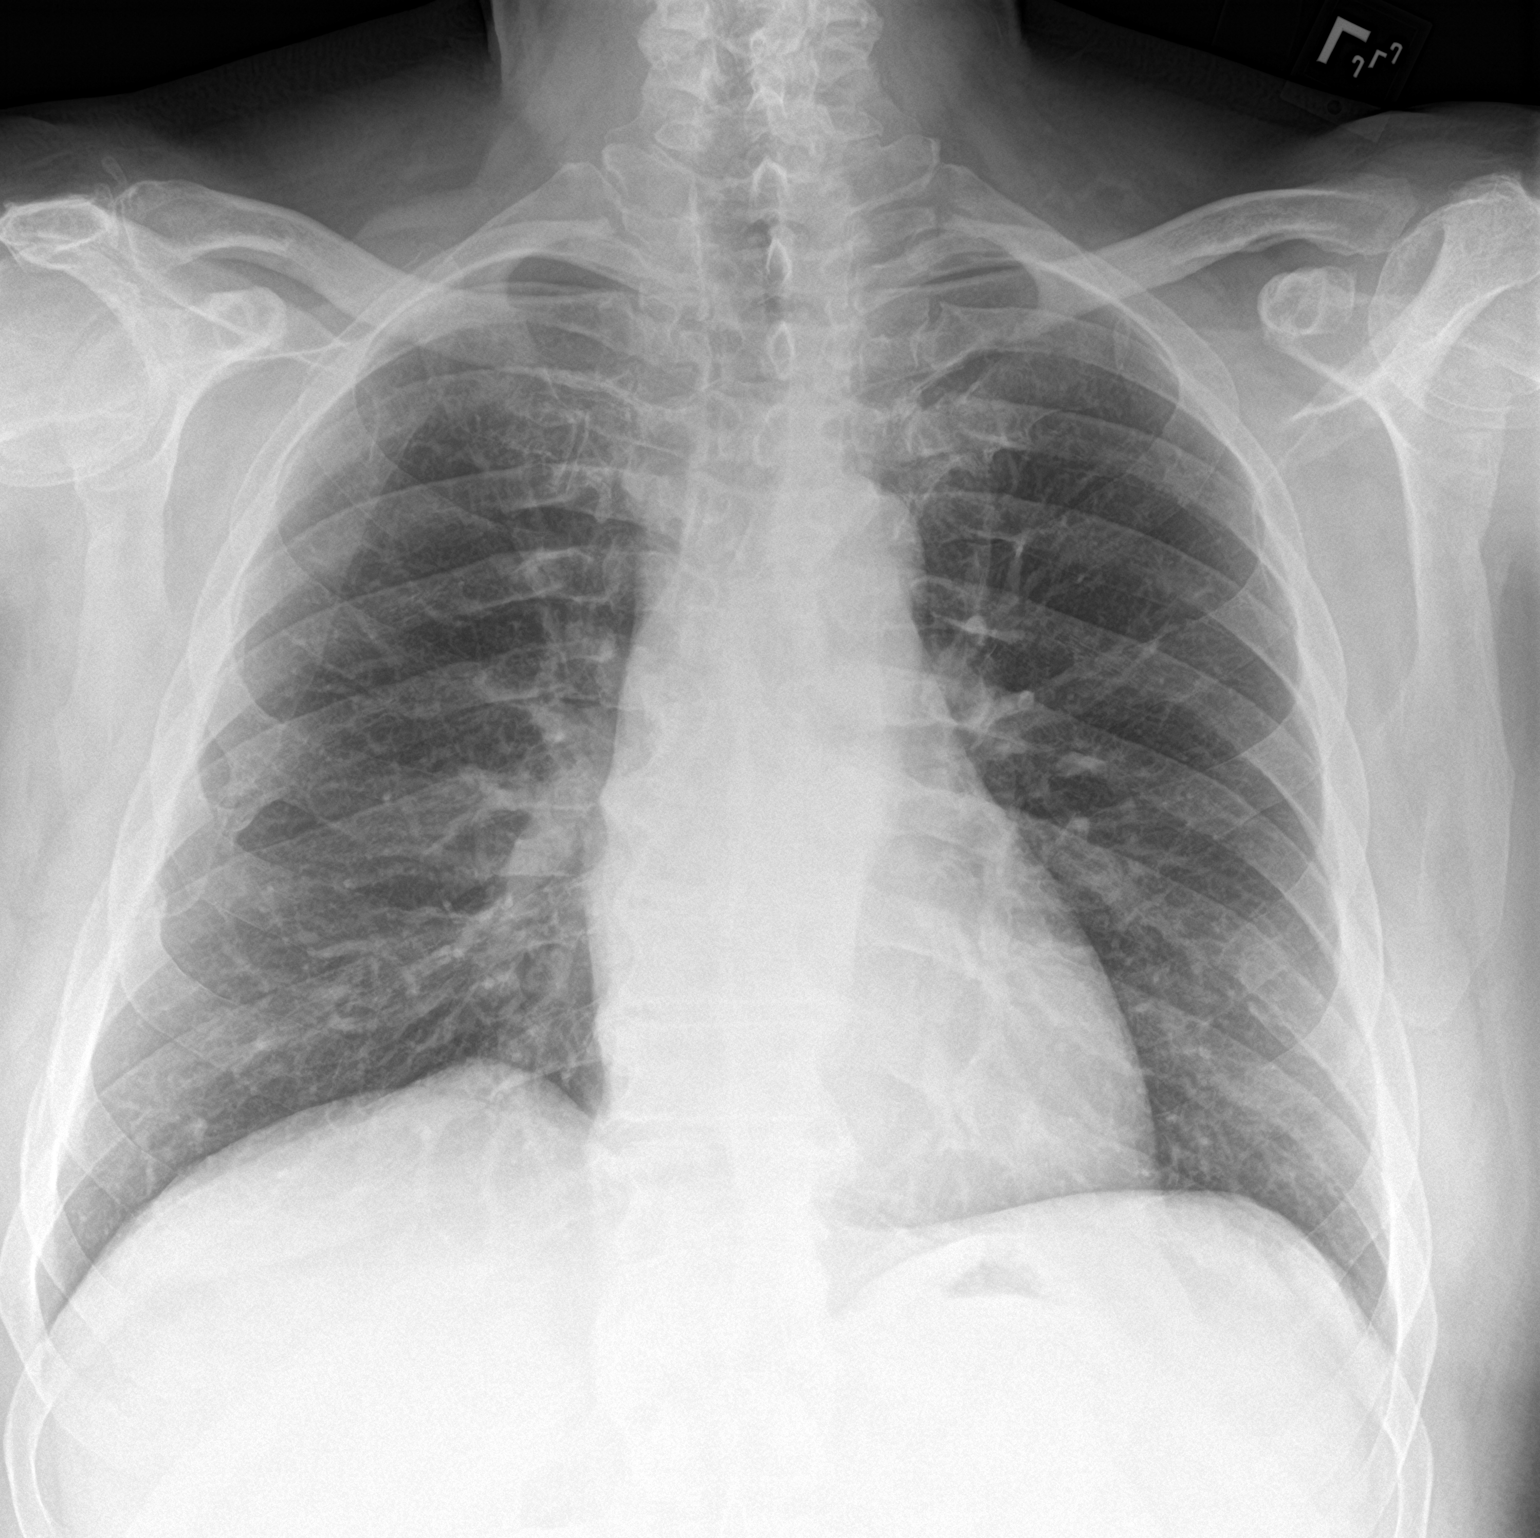

[chest lat]
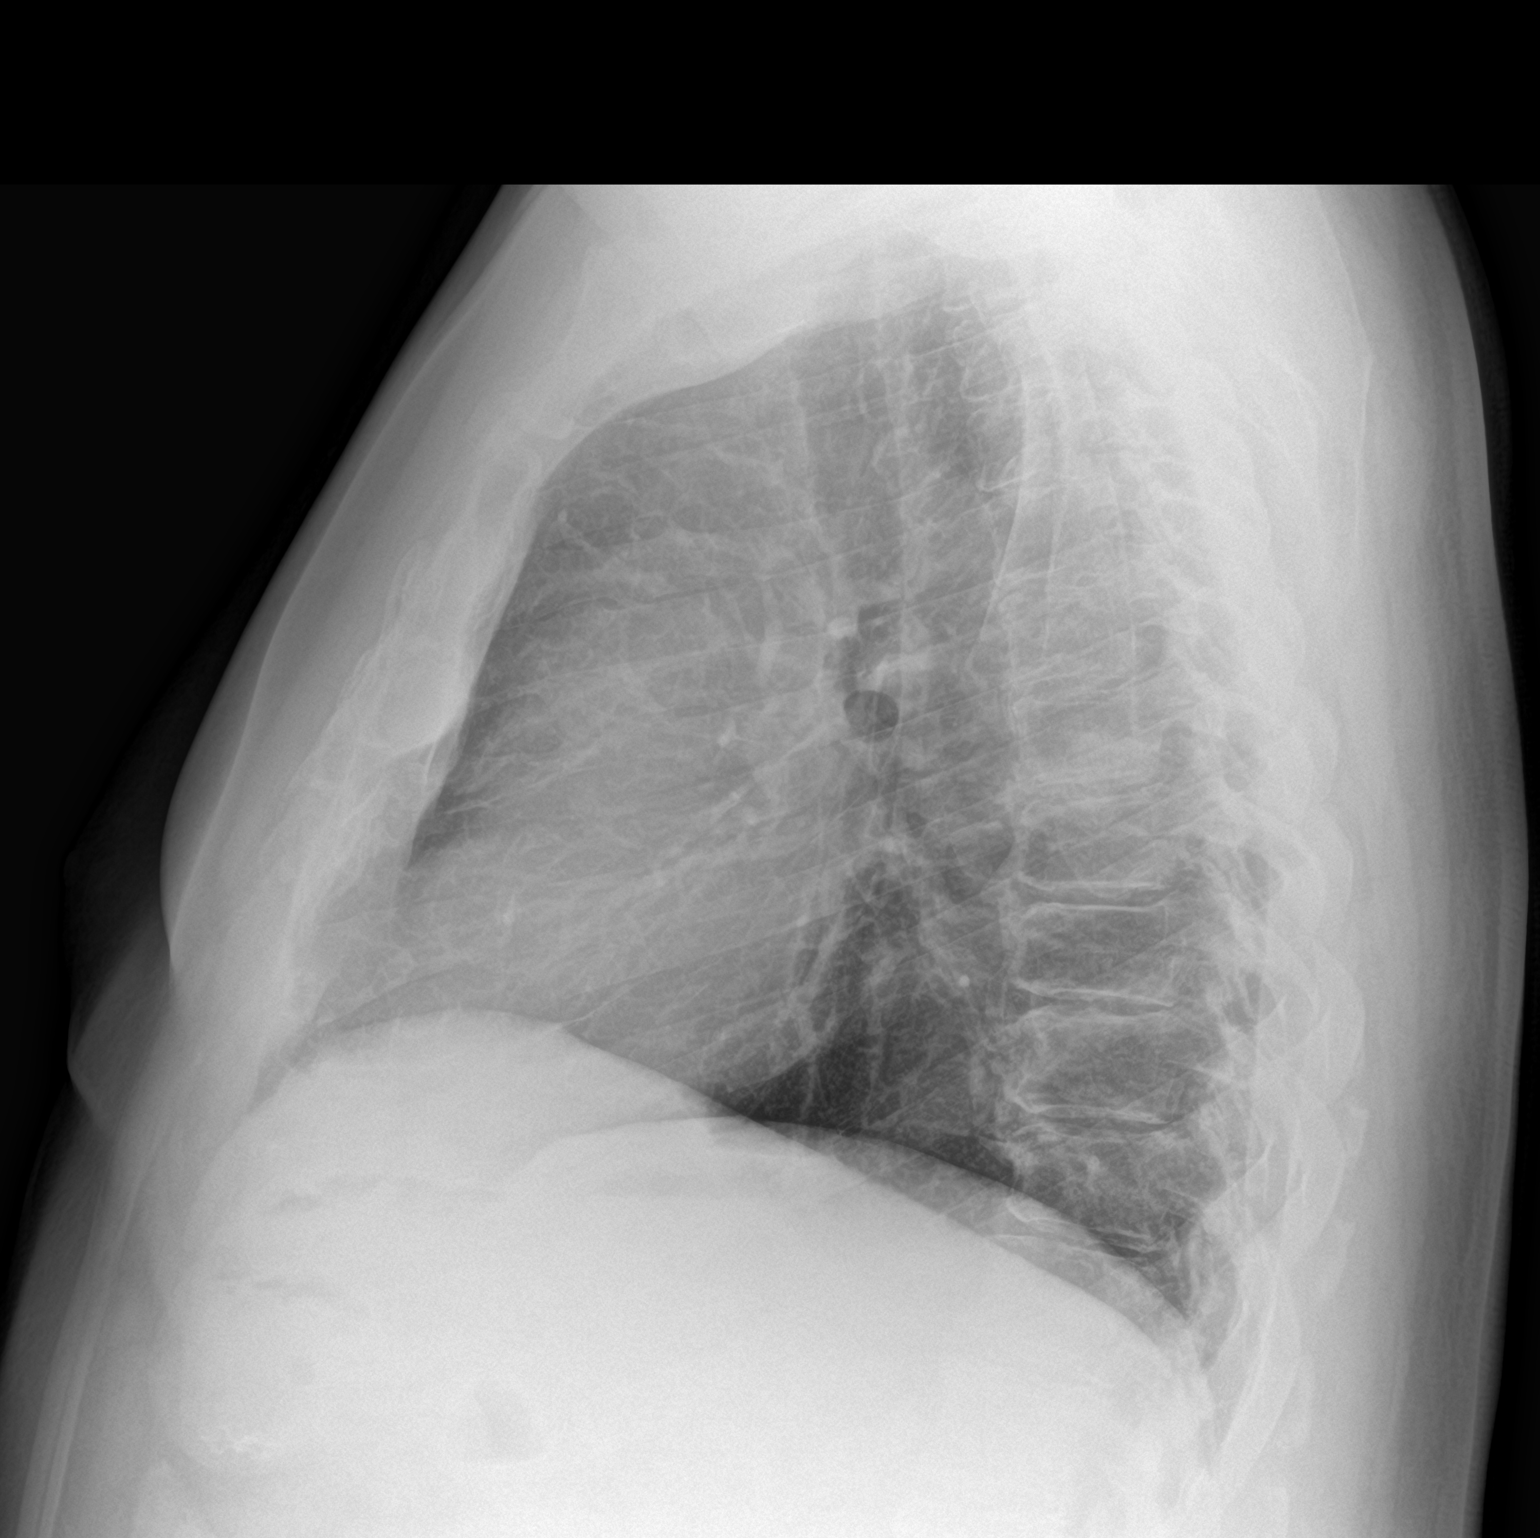

[2 of 2 positions shown; findings below may reference images not displayed]

FINDINGS: The heart size and mediastinal contours are within normal limits.
Both lungs are clear. Several old bilateral rib fracture deformities
again noted as well as thoracic spine degenerative changes .
IMPRESSION: No active cardiopulmonary disease.

## 2022-11-08 ENCOUNTER — Encounter (INDEPENDENT_AMBULATORY_CARE_PROVIDER_SITE_OTHER): Payer: Self-pay | Admitting: *Deleted
